# Patient Record
Sex: Female | Born: 1970
Health system: Southern US, Community
[De-identification: ages and names within clinical notes are randomized; demographics above are authoritative.]

---

## 2001-03-12 ENCOUNTER — Emergency Department (HOSPITAL_COMMUNITY): Admission: EM | Admit: 2001-03-12 | Discharge: 2001-03-12 | Payer: Self-pay | Admitting: *Deleted

## 2001-03-15 ENCOUNTER — Encounter: Payer: Self-pay | Admitting: Emergency Medicine

## 2001-03-15 ENCOUNTER — Emergency Department (HOSPITAL_COMMUNITY): Admission: EM | Admit: 2001-03-15 | Discharge: 2001-03-15 | Payer: Self-pay | Admitting: Emergency Medicine

## 2002-03-25 ENCOUNTER — Emergency Department (HOSPITAL_COMMUNITY): Admission: EM | Admit: 2002-03-25 | Discharge: 2002-03-25 | Payer: Self-pay | Admitting: *Deleted

## 2002-10-19 ENCOUNTER — Other Ambulatory Visit: Admission: RE | Admit: 2002-10-19 | Discharge: 2002-10-19 | Payer: Self-pay | Admitting: *Deleted

## 2003-09-24 ENCOUNTER — Encounter: Payer: Self-pay | Admitting: Obstetrics & Gynecology

## 2003-09-24 ENCOUNTER — Ambulatory Visit (HOSPITAL_COMMUNITY): Admission: RE | Admit: 2003-09-24 | Discharge: 2003-09-24 | Payer: Self-pay | Admitting: Obstetrics & Gynecology

## 2003-10-21 ENCOUNTER — Inpatient Hospital Stay (HOSPITAL_COMMUNITY): Admission: AD | Admit: 2003-10-21 | Discharge: 2003-10-24 | Payer: Self-pay | Admitting: Obstetrics & Gynecology

## 2005-07-10 ENCOUNTER — Emergency Department (HOSPITAL_COMMUNITY): Admission: EM | Admit: 2005-07-10 | Discharge: 2005-07-10 | Payer: Self-pay | Admitting: Emergency Medicine

## 2007-08-25 ENCOUNTER — Ambulatory Visit (HOSPITAL_COMMUNITY): Admission: RE | Admit: 2007-08-25 | Discharge: 2007-08-25 | Payer: Self-pay | Admitting: Obstetrics & Gynecology

## 2008-01-10 ENCOUNTER — Inpatient Hospital Stay (HOSPITAL_COMMUNITY): Admission: AD | Admit: 2008-01-10 | Discharge: 2008-01-13 | Payer: Self-pay | Admitting: Obstetrics & Gynecology

## 2009-02-22 IMAGING — US US OB DETAIL+14 WK
1 series · 14 of 28 positions shown · non-contrast
Comparison: none

OBSTETRICAL ULTRASOUND:

 This ultrasound exam was performed in the [HOSPITAL] Ultrasound Department.  The OB US report was generated in the AS system, and faxed to the ordering physician.  This report is also available in [REDACTED] PACS.

[Series 1: us ob detail+14 wk · 0.30mm/px · 14 of 67 slices shown]
[im 3/67]
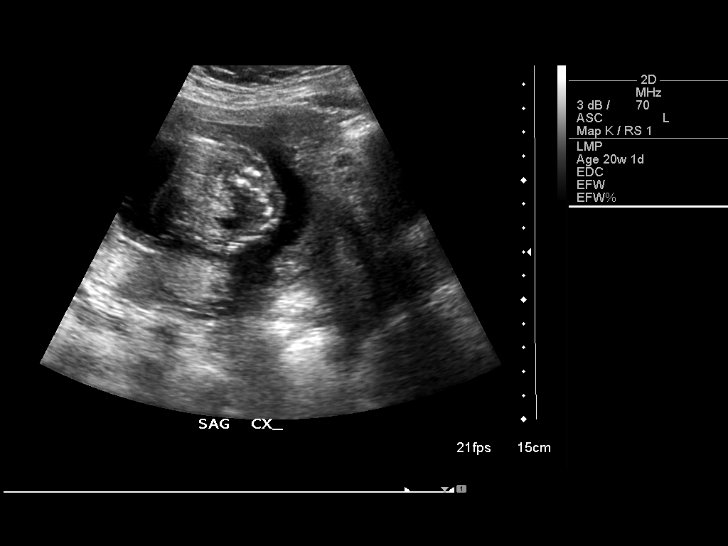
[im 8/67]
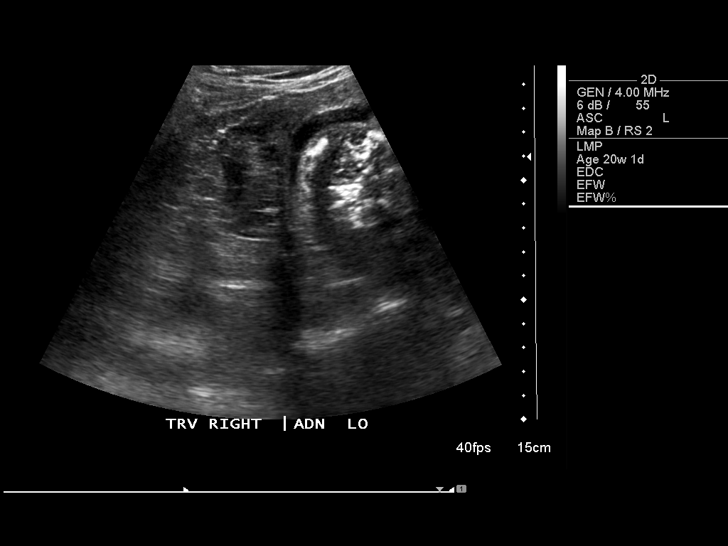
[im 13/67]
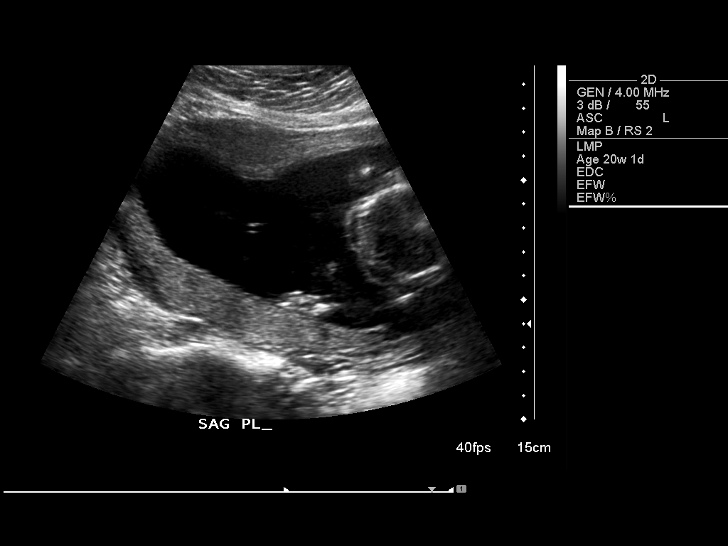
[im 18/67]
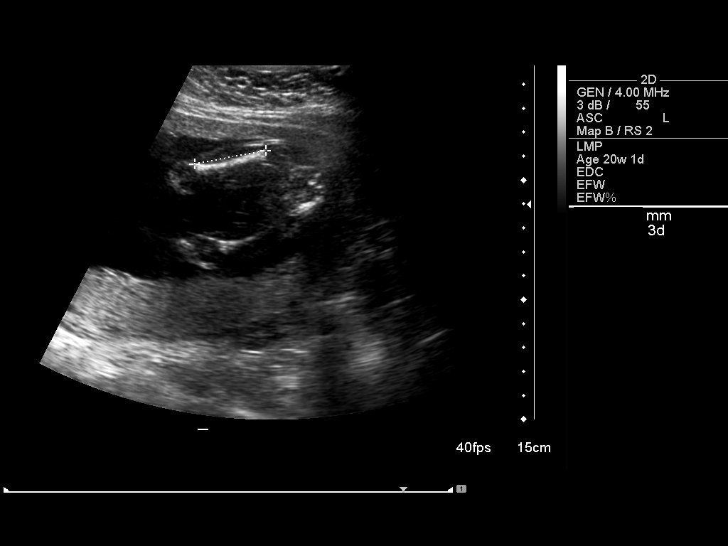
[im 23/67]
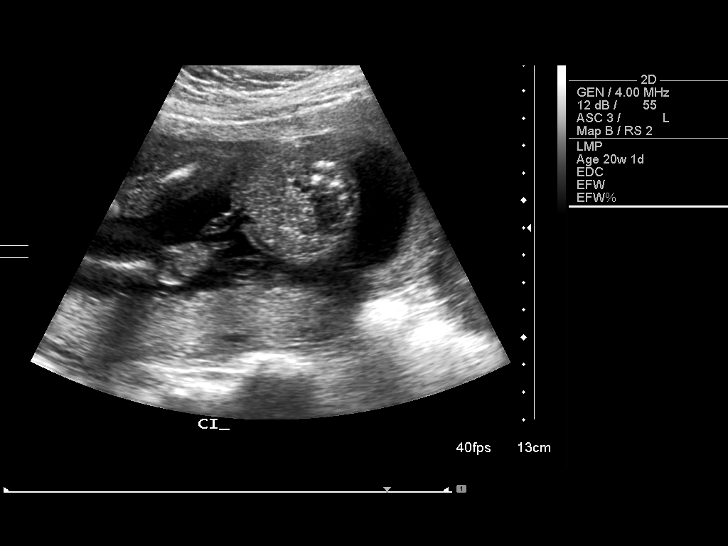
[im 27/67]
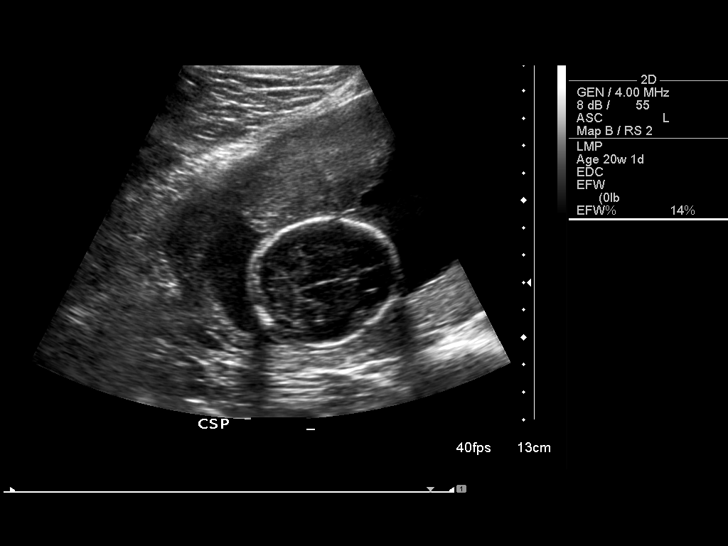
[im 32/67]
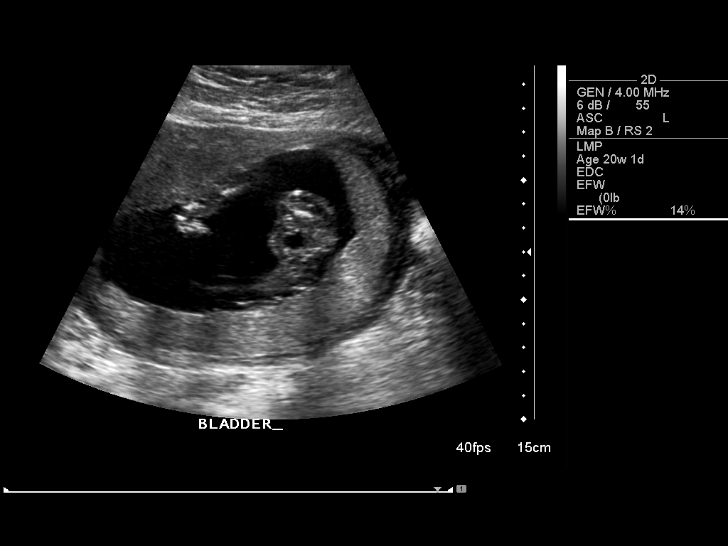
[im 37/67]
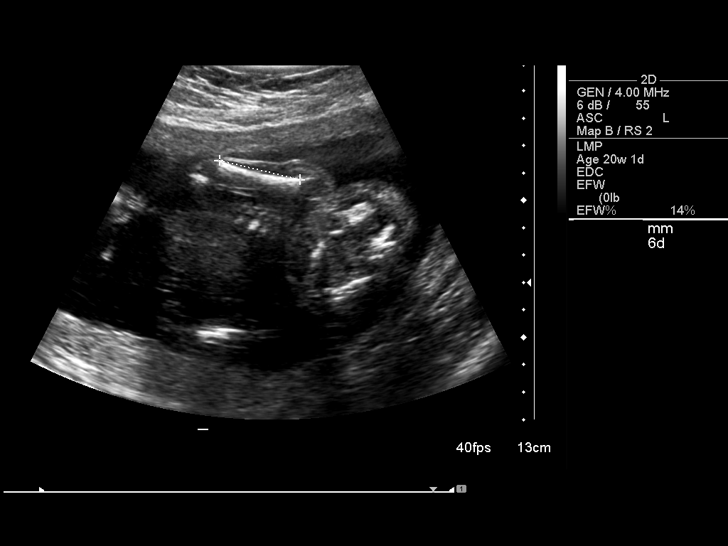
[im 42/67]
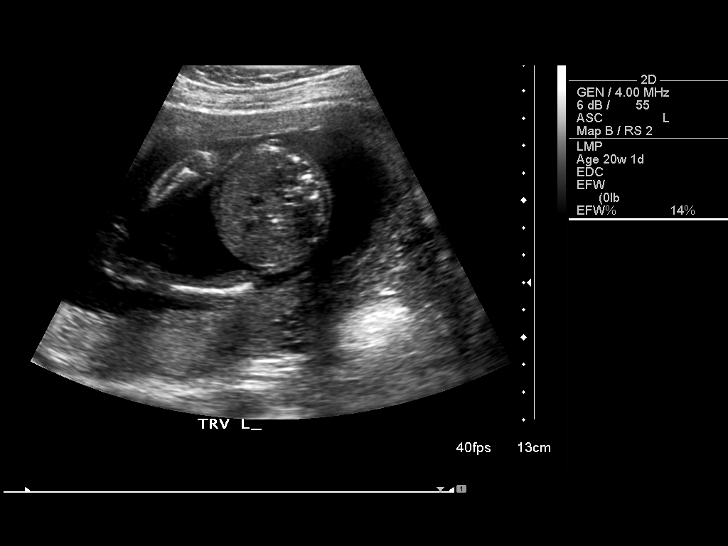
[im 47/67]
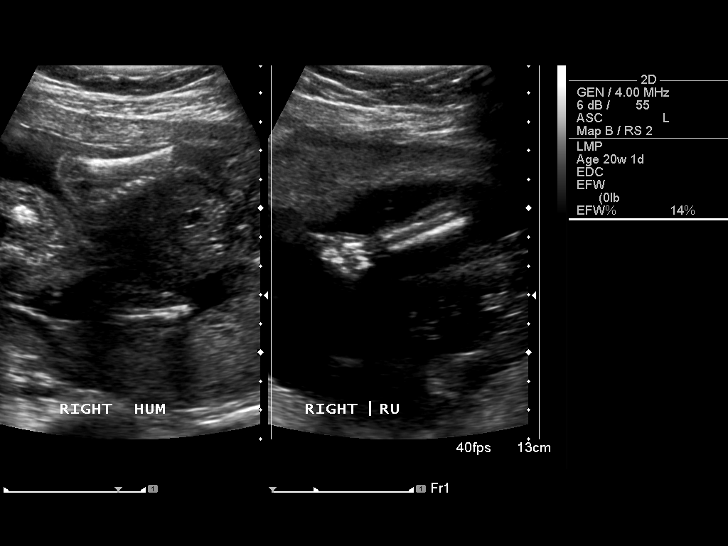
[im 52/67]
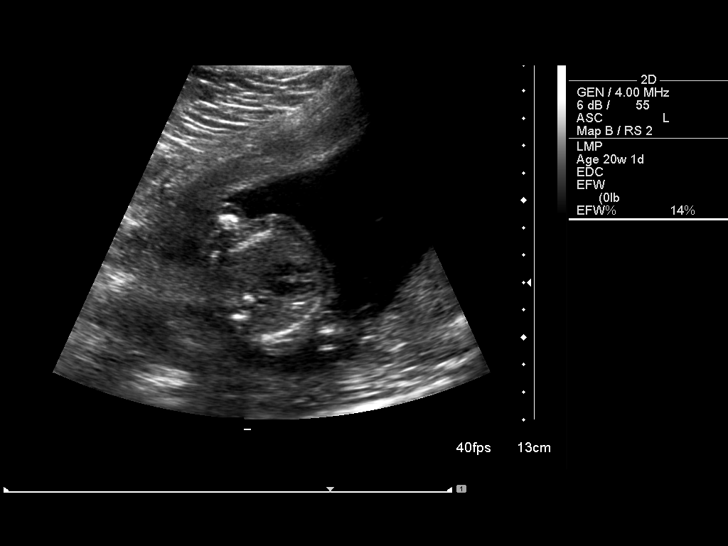
[im 57/67]
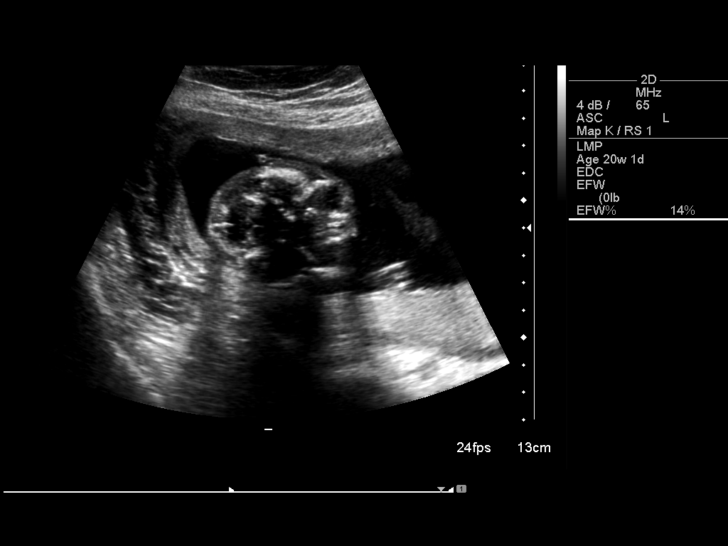
[im 62/67]
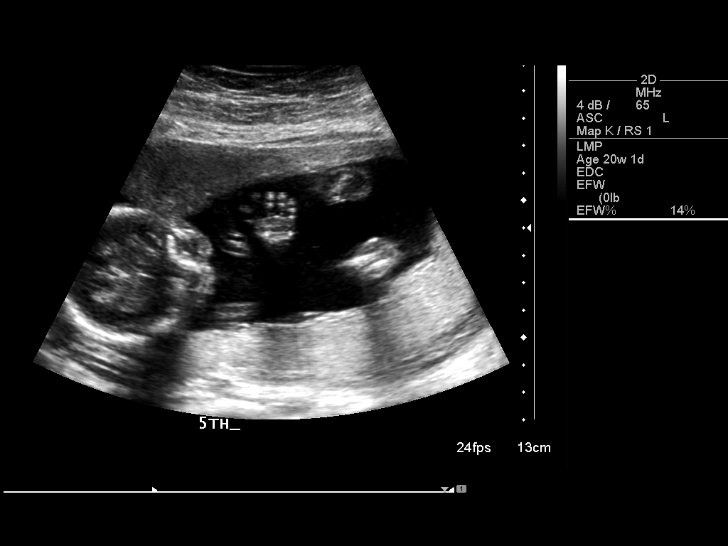
[im 67/67]
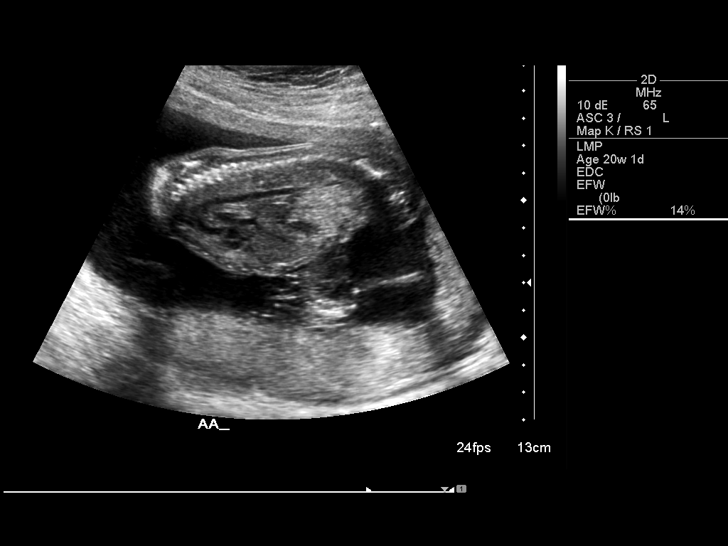

[14 of 28 positions shown; findings below may reference images not displayed]

IMPRESSION: See AS Obstetric US report.

## 2011-05-01 NOTE — H&P (Signed)
NAME:  Alyssa Gonzales, Alyssa Gonzales                    ACCOUNT NO.:  [id number]   MEDICAL RECORD NO.:  [id number]          PATIENT TYPE:  INP   LOCATION:  9127                          FACILITY:  [facility]   PHYSICIAN:  Roseanna Rainbow, M.D.DATE OF BIRTH:  Oct 28, 1971   DATE OF ADMISSION:  01/10/2008  DATE OF DISCHARGE:                              HISTORY & PHYSICAL   CHIEF COMPLAINT:  The patient is a 40 year old para 2 with an estimated  date of confinement of January 13, 2008, with an intrauterine pregnancy  at 39+ weeks complaining of uterine contractions.   HISTORY OF PRESENT ILLNESS:  Please see the above.   ALLERGIES:  No known drug allergies.   MEDICATIONS:  Please see the reconciliation form.   OB RISK FACTORS:  Advanced maternal age.   PRENATAL LABORATORIES:  Chlamydia probe negative.  Pap smear negative.  Urine culture and sensitivity:  No growth.  GC probe negative.  One-hour  GCT 127.  Hepatitis B surface antigen negative.  Hematocrit 33.5,  hemoglobin 10.8.  HIV nonreactive.  Hemoglobin electrophoresis normal.  Platelets 284,000.  Blood type AB positive, antibody screen negative.  RPR nonreactive.  Rubella immune.  GBS is negative on December 22.   OBSTETRICAL HISTORY:  In November 2004 she was delivered of a live-born  female, 7 pounds 3 ounces, full-term vaginal delivery, no complications.  In October 1997 she was delivered of a live-born female, vaginal  delivery, 6 pounds 2 ounces, no complications.   PAST GYN HISTORY:  Noncontributory.   PAST MEDICAL HISTORY:  No significant history of medical diseases.   PAST SURGICAL HISTORY:  No previous surgery.   SOCIAL HISTORY:  Married, living with her spouse.  Does not give any  significant history of alcohol usage.  Has no significant smoking  history.   FAMILY HISTORY:  No major illnesses known.   PHYSICAL EXAMINATION:  Vital signs stable, afebrile.  Fetal heart  tracing reassuring.  Sterile vaginal exam per the RN:  The cervix  is a  loose 2, 60-80%.   ASSESSMENT:  Multipara at term, early labor.  Fetal heart tracing  consistent with fetal well-being.   PLAN:  Expectant management, possible augmentation of labor.      Roseanna Rainbow, M.D.  Electronically Signed     LAJ/MEDQ  D:  01/10/2008  T:  01/11/2008  Job:  [id number]

## 2011-07-12 ENCOUNTER — Other Ambulatory Visit: Payer: Self-pay | Admitting: Obstetrics and Gynecology

## 2011-07-12 DIAGNOSIS — N644 Mastodynia: Secondary | ICD-10-CM

## 2011-07-17 ENCOUNTER — Ambulatory Visit
Admission: RE | Admit: 2011-07-17 | Discharge: 2011-07-17 | Disposition: A | Discharge status: Home / Self Care | Payer: BC Managed Care – PPO | Source: Ambulatory Visit | Attending: Obstetrics and Gynecology | Admitting: Obstetrics and Gynecology

## 2011-07-17 DIAGNOSIS — N644 Mastodynia: Secondary | ICD-10-CM

## 2011-09-06 LAB — CBC
HCT: 31.9 — ABNORMAL LOW
HCT: 32.3 — ABNORMAL LOW
HCT: 37.5
Hemoglobin: 10.5 — ABNORMAL LOW
Hemoglobin: 10.7 — ABNORMAL LOW
Hemoglobin: 12.3
MCHC: 32.7
MCHC: 33
MCHC: 33.2
MCV: 69.6 — ABNORMAL LOW
MCV: 70 — ABNORMAL LOW
MCV: 70.3 — ABNORMAL LOW
Platelets: 239
Platelets: 251
Platelets: 277
RBC: 4.58
RBC: 4.59
RBC: 5.36 — ABNORMAL HIGH
RDW: 15.8 — ABNORMAL HIGH
RDW: 15.9 — ABNORMAL HIGH
RDW: 16 — ABNORMAL HIGH
WBC: 11.2 — ABNORMAL HIGH
WBC: 13.8 — ABNORMAL HIGH
WBC: 14.9 — ABNORMAL HIGH

## 2011-09-06 LAB — RPR: RPR Ser Ql: NONREACTIVE

## 2014-09-24 ENCOUNTER — Other Ambulatory Visit: Payer: Self-pay | Admitting: Obstetrics and Gynecology

## 2014-09-24 DIAGNOSIS — N644 Mastodynia: Secondary | ICD-10-CM

## 2014-09-30 ENCOUNTER — Ambulatory Visit
Admission: RE | Admit: 2014-09-30 | Discharge: 2014-09-30 | Disposition: A | Discharge status: Home / Self Care | Payer: BC Managed Care – PPO | Source: Ambulatory Visit | Attending: Obstetrics and Gynecology | Admitting: Obstetrics and Gynecology

## 2014-09-30 DIAGNOSIS — N644 Mastodynia: Secondary | ICD-10-CM

## 2015-10-30 ENCOUNTER — Ambulatory Visit (INDEPENDENT_AMBULATORY_CARE_PROVIDER_SITE_OTHER): Payer: BLUE CROSS/BLUE SHIELD | Admitting: Family Medicine

## 2015-10-30 VITALS — BP 102/60 | HR 73 | Temp 98.7°F | Resp 16 | Ht 62.0 in | Wt 137.0 lb

## 2015-10-30 DIAGNOSIS — Z2821 Immunization not carried out because of patient refusal: Secondary | ICD-10-CM

## 2015-10-30 DIAGNOSIS — Z0000 Encounter for general adult medical examination without abnormal findings: Secondary | ICD-10-CM | POA: Diagnosis not present

## 2015-10-30 LAB — CBC
HCT: 36.7 % (ref 36.0–46.0)
Hemoglobin: 11.5 g/dL — ABNORMAL LOW (ref 12.0–15.0)
MCH: 20.9 pg — ABNORMAL LOW (ref 26.0–34.0)
MCHC: 31.3 g/dL (ref 30.0–36.0)
MCV: 66.6 fL — ABNORMAL LOW (ref 78.0–100.0)
MPV: 9.6 fL (ref 8.6–12.4)
Platelets: 289 10*3/uL (ref 150–400)
RBC: 5.51 MIL/uL — ABNORMAL HIGH (ref 3.87–5.11)
RDW: 16.3 % — ABNORMAL HIGH (ref 11.5–15.5)
WBC: 8.8 10*3/uL (ref 4.0–10.5)

## 2015-10-30 LAB — LIPID PANEL
Cholesterol: 159 mg/dL (ref 125–200)
HDL: 44 mg/dL — ABNORMAL LOW (ref >=46)
LDL Cholesterol: 94 mg/dL (ref <130)
Total CHOL/HDL Ratio: 3.6 Ratio (ref <=5.0)
Triglycerides: 107 mg/dL (ref <150)
VLDL: 21 mg/dL (ref <30)

## 2015-10-30 LAB — COMPLETE METABOLIC PANEL WITH GFR
ALT: 20 U/L (ref 6–29)
AST: 17 U/L (ref 10–30)
Albumin: 4.3 g/dL (ref 3.6–5.1)
Alkaline Phosphatase: 58 U/L (ref 33–115)
BUN: 16 mg/dL (ref 7–25)
CO2: 25 mmol/L (ref 20–31)
Calcium: 8.9 mg/dL (ref 8.6–10.2)
Chloride: 105 mmol/L (ref 98–110)
Creat: 0.46 mg/dL — ABNORMAL LOW (ref 0.50–1.10)
GFR, Est African American: >89 mL/min (ref >=60)
GFR, Est Non African American: >89 mL/min (ref >=60)
Glucose, Bld: 89 mg/dL (ref 65–99)
Potassium: 4.1 mmol/L (ref 3.5–5.3)
Sodium: 138 mmol/L (ref 135–146)
Total Bilirubin: 0.6 mg/dL (ref 0.2–1.2)
Total Protein: 7.3 g/dL (ref 6.1–8.1)

## 2015-10-30 NOTE — Progress Notes (Signed)
      Chief Complaint:  Chief Complaint  Patient presents with  . Annual Exam    Needs Insurance form completed     HPI: Alyssa Gonzales is a 44 y.o. female who [facility] complaining of:  Pap smear 2015-normal , Dr Hulen SkainsAkin Mammogram 2012, 2013-normal Decline flu vaccine, not sure if UTD on tetanus Has not eaten No sxs Non smoker Nail technician G3L3, normal vaginal birth   No past medical history on file. No past surgical history on file. Social History   Social History  . Marital Status: Married    Spouse Name: N/A  . Number of Children: N/A  . Years of Education: N/A   Social History Main Topics  . Smoking status: Never Smoker   . Smokeless tobacco: Not on file  . Alcohol Use: No  . Drug Use: No  . Sexual Activity: Not on file   Other Topics Concern  . Not on file   Social History Narrative  . No narrative on file   No family history on file. No Known Allergies Prior to Admission medications   Not on File     ROS: The patient denies fevers, chills, night sweats, unintentional weight loss, chest pain, palpitations, wheezing, dyspnea on exertion, nausea, vomiting, abdominal pain, dysuria, hematuria, melena, numbness, weakness, or tingling.   All other systems have been reviewed and were otherwise negative with the exception of those mentioned in the HPI and as above.    PHYSICAL EXAM: Filed Vitals:   10/30/15 0954  BP: 102/60  Pulse: 73  Temp: 98.7 F (37.1 C)  Resp: 16   Body mass index is 25.05 kg/(m^2).   General: Alert, no acute distress HEENT:  Normocephalic, atraumatic, oropharynx patent. EOMI, PERRLA, fundo exam normal, thyroid normal  Cardiovascular:  Regular rate and rhythm, no rubs murmurs or gallops.  No Carotid bruits, radial pulse intact. No pedal edema.  Respiratory: Clear to auscultation bilaterally.  No wheezes, rales, or rhonchi.  No cyanosis, no use of accessory musculature Abdominal: No organomegaly, abdomen is soft and  non-tender, positive bowel sounds. No masses. Skin: No rashes. Neurologic: Facial musculature symmetric. Psychiatric: Patient acts appropriately throughout our interaction. Lymphatic: No cervical or submandibular lymphadenopathy Musculoskeletal: Gait intact. No edema, tenderness. 5/5 strength, 2/2 DTRs   LABS:    EKG/XRAY:   Primary read [facility]   ASSESSMENT/PLAN: Encounter Diagnoses  Name Primary?  . Annual physical exam Yes  . Influenza vaccination declined    Labs pending Declines breast/pelvic exam, she gets it with ob/gyn Fu prn    Gross sideeffects, risk and benefits, and alternatives of medications d/w patient. Patient is aware that all medications have potential sideeffects and we are unable to predict every sideeffect or drug-drug interaction that may occur.    DO  11/02/2015 6:43 AM

## 2016-03-02 ENCOUNTER — Encounter: Payer: Self-pay | Admitting: Family Medicine

## 2016-10-24 ENCOUNTER — Encounter: Payer: Self-pay | Admitting: Family Medicine

## 2016-10-24 ENCOUNTER — Other Ambulatory Visit: Payer: Self-pay | Admitting: Physician Assistant

## 2016-10-24 ENCOUNTER — Ambulatory Visit (INDEPENDENT_AMBULATORY_CARE_PROVIDER_SITE_OTHER): Payer: BLUE CROSS/BLUE SHIELD | Admitting: Physician Assistant

## 2016-10-24 VITALS — BP 96/60 | HR 70 | Temp 98.5°F | Resp 14 | Ht 61.0 in | Wt 132.0 lb

## 2016-10-24 DIAGNOSIS — Z23 Encounter for immunization: Secondary | ICD-10-CM | POA: Diagnosis not present

## 2016-10-24 DIAGNOSIS — Z1211 Encounter for screening for malignant neoplasm of colon: Secondary | ICD-10-CM | POA: Diagnosis not present

## 2016-10-24 DIAGNOSIS — Z1329 Encounter for screening for other suspected endocrine disorder: Secondary | ICD-10-CM | POA: Diagnosis not present

## 2016-10-24 DIAGNOSIS — Z131 Encounter for screening for diabetes mellitus: Secondary | ICD-10-CM

## 2016-10-24 DIAGNOSIS — Z1159 Encounter for screening for other viral diseases: Secondary | ICD-10-CM | POA: Diagnosis not present

## 2016-10-24 DIAGNOSIS — Z13220 Encounter for screening for lipoid disorders: Secondary | ICD-10-CM | POA: Diagnosis not present

## 2016-10-24 DIAGNOSIS — Z0000 Encounter for general adult medical examination without abnormal findings: Secondary | ICD-10-CM | POA: Diagnosis not present

## 2016-10-24 DIAGNOSIS — Z1389 Encounter for screening for other disorder: Secondary | ICD-10-CM | POA: Diagnosis not present

## 2016-10-24 DIAGNOSIS — Z130 Encounter for screening for diseases of the blood and blood-forming organs and certain disorders involving the immune mechanism: Secondary | ICD-10-CM

## 2016-10-24 DIAGNOSIS — Z114 Encounter for screening for human immunodeficiency virus [HIV]: Secondary | ICD-10-CM

## 2016-10-24 LAB — LIPID PANEL
Cholesterol: 192 mg/dL (ref <200)
HDL: 45 mg/dL — ABNORMAL LOW (ref >50)
LDL Cholesterol: 128 mg/dL — ABNORMAL HIGH
Total CHOL/HDL Ratio: 4.3 Ratio (ref <5.0)
Triglycerides: 93 mg/dL (ref <150)
VLDL: 19 mg/dL (ref <30)

## 2016-10-24 LAB — COMPLETE METABOLIC PANEL WITH GFR
ALT: 23 U/L (ref 6–29)
AST: 27 U/L (ref 10–35)
Albumin: 4.2 g/dL (ref 3.6–5.1)
Alkaline Phosphatase: 51 U/L (ref 33–115)
BUN: 12 mg/dL (ref 7–25)
CO2: 26 mmol/L (ref 20–31)
Calcium: 8.9 mg/dL (ref 8.6–10.2)
Chloride: 105 mmol/L (ref 98–110)
Creat: 0.53 mg/dL (ref 0.50–1.10)
GFR, Est African American: >89 mL/min (ref >=60)
GFR, Est Non African American: >89 mL/min (ref >=60)
Glucose, Bld: 84 mg/dL (ref 65–99)
Potassium: 4 mmol/L (ref 3.5–5.3)
Sodium: 138 mmol/L (ref 135–146)
Total Bilirubin: 0.4 mg/dL (ref 0.2–1.2)
Total Protein: 7.1 g/dL (ref 6.1–8.1)

## 2016-10-24 LAB — HEPATITIS C ANTIBODY: HCV Ab: NEGATIVE

## 2016-10-24 LAB — CBC
HCT: 39.6 % (ref 35.0–45.0)
Hemoglobin: 12.4 g/dL (ref 11.7–15.5)
MCH: 21.5 pg — ABNORMAL LOW (ref 27.0–33.0)
MCHC: 31.3 g/dL — ABNORMAL LOW (ref 32.0–36.0)
MCV: 68.5 fL — ABNORMAL LOW (ref 80.0–100.0)
Platelets: 293 10*3/uL (ref 140–400)
RBC: 5.78 MIL/uL — ABNORMAL HIGH (ref 3.80–5.10)
RDW: 17 % — ABNORMAL HIGH (ref 11.0–15.0)
WBC: 7.4 10*3/uL (ref 3.8–10.8)

## 2016-10-24 LAB — HIV ANTIBODY (ROUTINE TESTING W REFLEX): HIV 1&2 Ab, 4th Generation: NONREACTIVE

## 2016-10-24 LAB — TSH: TSH: 2.14 mIU/L

## 2016-10-24 LAB — HEMOGLOBIN A1C
Hgb A1c MFr Bld: 5.4 % (ref <5.7)
Mean Plasma Glucose: 108 mg/dL

## 2016-10-24 NOTE — Patient Instructions (Signed)
     IF you received an x-ray today, you will receive an invoice from [facility]. Please contact [facility] at [phone] with questions or concerns regarding your invoice.   IF you received labwork today, you will receive an invoice from [facility]. Please contact [facility] at [phone] with questions or concerns regarding your invoice.   Our billing staff will not be able to assist you with questions regarding bills from these companies.  You will be contacted with the lab results as soon as they are available. The fastest way to get your results is to activate your My Chart account. Instructions are located on the last page of this paperwork. If you have not heard from us regarding the results in 2 weeks, please contact this office.      

## 2016-10-24 NOTE — Progress Notes (Signed)
Please add peripheral smear, dx microcytosis.

## 2016-10-24 NOTE — Progress Notes (Signed)
10/24/2016 9:02 AM   DOB: 1971/05/28 / MRN: [id number]  SUBJECTIVE:  Interpreter [id number]  Alyssa Gonzales is a 45 y.o. female presenting for annual exam.  Reports she had a PAP in October of 2016 at [facility] [address] and this was normal. She also had a mammogram at that time. She has never had a colonoscopy and would like this ordered today.     She has No Known Allergies.   She  has no past medical history on file.    She  reports that she has never smoked. She does not have any smokeless tobacco history on file. She reports that she does not drink alcohol or use drugs. She  has no sexual activity history on file. The patient  has no past surgical history on file.  Her family history includes Diabetes in her father.  Review of Systems  Constitutional: Negative for chills and fever.  Cardiovascular: Negative for chest pain.  Musculoskeletal: Negative for myalgias.  Skin: Negative for itching and rash.  Neurological: Negative for dizziness.    The problem list and medications were reviewed and updated by myself where necessary and exist elsewhere in the encounter.   OBJECTIVE:  BP 96/60   Pulse 70   Temp 98.5 F (36.9 C) (Oral)   Resp 14   Ht 5\' 1"  (1.549 m)   Wt 132 lb (59.9 kg)   LMP 10/16/2016   SpO2 100%   BMI 24.94 kg/m   Physical Exam  Constitutional: She is oriented to person, place, and time. She appears well-developed and well-nourished. No distress.  Cardiovascular: Normal rate and regular rhythm.   Pulmonary/Chest: Effort normal and breath sounds normal.  Abdominal: Bowel sounds are normal.  Musculoskeletal: Normal range of motion.  Neurological: She is alert and oriented to person, place, and time.  Skin: Skin is warm and dry. She is not diaphoretic.  Psychiatric: She has a normal mood and affect.    No results found for this or any previous visit (from the past 72 hour(s)).  No results found.  ASSESSMENT AND PLAN  Alyssa Gonzales was seen today for  annual exam.  Diagnoses and all orders for this visit:  Annual physical exam: [facility] and was unaware of the need for colonoscopy.  I have placed and will fill in any gaps in care. Advised that she continue to see her GYN for female oriented care however she should also come here for screenings across the spectrum of care.   Screening for deficiency anemia -     CBC  Screening for nephropathy -     COMPLETE METABOLIC PANEL WITH GFR  Screening for lipid disorders -     Lipid panel  Screening for thyroid disorder -     TSH  Screening for diabetes mellitus -     Hemoglobin A1c  Screening for HIV (human immunodeficiency virus) -     HIV antibody  Need for hepatitis C screening test -     Hepatitis C antibody  Screening for colon cancer -     Ambulatory referral to Gastroenterology  Needs flu shot -     Flu Vaccine QUAD 36+ mos IM    The patient is advised to call or return to clinic if she does not see an improvement in symptoms, or to seek the care of the closest emergency department if she worsens with the above plan.   Deliah BostonMichael Clark, MHS, PA-C  [facility] 10/24/2016 9:02 AM

## 2016-10-25 LAB — DIFFERENTIAL
Basophils Absolute: 0 cells/uL (ref 0–200)
Basophils Relative: 0 %
Eosinophils Absolute: 76 cells/uL (ref 15–500)
Eosinophils Relative: 1 %
Lymphocytes Relative: 28 %
Lymphs Abs: 2128 cells/uL (ref 850–3900)
Monocytes Absolute: 380 cells/uL (ref 200–950)
Monocytes Relative: 5 %
Neutro Abs: 5016 cells/uL (ref 1500–7800)
Neutrophils Relative %: 66 %

## 2016-10-26 NOTE — Progress Notes (Signed)
Did we add the peripheral smear or save smear? If we did not she will need to come back in for a blood draw only. Deliah BostonMichael Lavar Rosenzweig, MS, PA-C 8:33 AM, 10/26/2016

## 2016-12-27 ENCOUNTER — Encounter: Payer: Self-pay | Admitting: Physician Assistant

## 2017-12-05 DIAGNOSIS — Z01419 Encounter for gynecological examination (general) (routine) without abnormal findings: Secondary | ICD-10-CM | POA: Diagnosis not present

## 2017-12-05 DIAGNOSIS — Z1231 Encounter for screening mammogram for malignant neoplasm of breast: Secondary | ICD-10-CM | POA: Diagnosis not present

## 2017-12-05 DIAGNOSIS — Z6825 Body mass index (BMI) 25.0-25.9, adult: Secondary | ICD-10-CM | POA: Diagnosis not present

## 2018-12-11 DIAGNOSIS — Z0000 Encounter for general adult medical examination without abnormal findings: Secondary | ICD-10-CM | POA: Diagnosis not present

## 2018-12-24 DIAGNOSIS — Z1231 Encounter for screening mammogram for malignant neoplasm of breast: Secondary | ICD-10-CM | POA: Diagnosis not present

## 2018-12-24 DIAGNOSIS — Z01419 Encounter for gynecological examination (general) (routine) without abnormal findings: Secondary | ICD-10-CM | POA: Diagnosis not present

## 2018-12-24 DIAGNOSIS — Z6824 Body mass index (BMI) 24.0-24.9, adult: Secondary | ICD-10-CM | POA: Diagnosis not present

## 2018-12-29 ENCOUNTER — Encounter (HOSPITAL_COMMUNITY): Payer: Self-pay | Admitting: Emergency Medicine

## 2018-12-29 ENCOUNTER — Emergency Department (HOSPITAL_COMMUNITY)
Admission: EM | Admit: 2018-12-29 | Discharge: 2018-12-29 | Disposition: A | Discharge status: Home / Self Care | Payer: BLUE CROSS/BLUE SHIELD | Attending: Emergency Medicine | Admitting: Emergency Medicine

## 2018-12-29 ENCOUNTER — Other Ambulatory Visit: Payer: Self-pay

## 2018-12-29 ENCOUNTER — Emergency Department (HOSPITAL_COMMUNITY): Payer: BLUE CROSS/BLUE SHIELD

## 2018-12-29 DIAGNOSIS — R1084 Generalized abdominal pain: Secondary | ICD-10-CM | POA: Diagnosis not present

## 2018-12-29 DIAGNOSIS — R0689 Other abnormalities of breathing: Secondary | ICD-10-CM | POA: Diagnosis not present

## 2018-12-29 DIAGNOSIS — R52 Pain, unspecified: Secondary | ICD-10-CM | POA: Diagnosis not present

## 2018-12-29 DIAGNOSIS — M549 Dorsalgia, unspecified: Secondary | ICD-10-CM | POA: Diagnosis not present

## 2018-12-29 DIAGNOSIS — K219 Gastro-esophageal reflux disease without esophagitis: Secondary | ICD-10-CM | POA: Diagnosis not present

## 2018-12-29 DIAGNOSIS — R109 Unspecified abdominal pain: Secondary | ICD-10-CM | POA: Diagnosis not present

## 2018-12-29 DIAGNOSIS — R064 Hyperventilation: Secondary | ICD-10-CM | POA: Diagnosis not present

## 2018-12-29 DIAGNOSIS — E876 Hypokalemia: Secondary | ICD-10-CM | POA: Insufficient documentation

## 2018-12-29 DIAGNOSIS — R079 Chest pain, unspecified: Secondary | ICD-10-CM | POA: Diagnosis not present

## 2018-12-29 DIAGNOSIS — R200 Anesthesia of skin: Secondary | ICD-10-CM | POA: Insufficient documentation

## 2018-12-29 LAB — I-STAT CHEM 8, ED
BUN: 21 mg/dL — ABNORMAL HIGH (ref 6–20)
Calcium, Ion: 1.04 mmol/L — ABNORMAL LOW (ref 1.15–1.40)
Chloride: 107 mmol/L (ref 98–111)
Creatinine, Ser: 0.5 mg/dL (ref 0.44–1.00)
Glucose, Bld: 164 mg/dL — ABNORMAL HIGH (ref 70–99)
HCT: 37 % (ref 36.0–46.0)
Hemoglobin: 12.6 g/dL (ref 12.0–15.0)
Potassium: 2.6 mmol/L — CL (ref 3.5–5.1)
Sodium: 140 mmol/L (ref 135–145)
TCO2: 17 mmol/L — ABNORMAL LOW (ref 22–32)

## 2018-12-29 LAB — CBC
HCT: 38.4 % (ref 36.0–46.0)
Hemoglobin: 11.4 g/dL — ABNORMAL LOW (ref 12.0–15.0)
MCH: 20.1 pg — ABNORMAL LOW (ref 26.0–34.0)
MCHC: 29.7 g/dL — ABNORMAL LOW (ref 30.0–36.0)
MCV: 67.7 fL — ABNORMAL LOW (ref 80.0–100.0)
Platelets: 246 10*3/uL (ref 150–400)
RBC: 5.67 MIL/uL — ABNORMAL HIGH (ref 3.87–5.11)
RDW: 15.6 % — ABNORMAL HIGH (ref 11.5–15.5)
WBC: 11.2 10*3/uL — ABNORMAL HIGH (ref 4.0–10.5)
nRBC: 0 % (ref 0.0–0.2)

## 2018-12-29 LAB — URINALYSIS, ROUTINE W REFLEX MICROSCOPIC
Bilirubin Urine: NEGATIVE
Glucose, UA: NEGATIVE mg/dL
Hgb urine dipstick: NEGATIVE
Ketones, ur: 5 mg/dL — AB
Leukocytes, UA: NEGATIVE
Nitrite: NEGATIVE
Protein, ur: NEGATIVE mg/dL
Specific Gravity, Urine: >1.046 — ABNORMAL HIGH (ref 1.005–1.030)
pH: 7 (ref 5.0–8.0)

## 2018-12-29 LAB — COMPREHENSIVE METABOLIC PANEL
ALT: 14 U/L (ref 0–44)
AST: 29 U/L (ref 15–41)
Albumin: 3.7 g/dL (ref 3.5–5.0)
Alkaline Phosphatase: 51 U/L (ref 38–126)
Anion gap: 15 (ref 5–15)
BUN: 20 mg/dL (ref 6–20)
CO2: 17 mmol/L — ABNORMAL LOW (ref 22–32)
Calcium: 9 mg/dL (ref 8.9–10.3)
Chloride: 107 mmol/L (ref 98–111)
Creatinine, Ser: 0.77 mg/dL (ref 0.44–1.00)
GFR calc Af Amer: >60 mL/min (ref >60)
GFR calc non Af Amer: >60 mL/min (ref >60)
Glucose, Bld: 166 mg/dL — ABNORMAL HIGH (ref 70–99)
Potassium: 2.8 mmol/L — ABNORMAL LOW (ref 3.5–5.1)
Sodium: 139 mmol/L (ref 135–145)
Total Bilirubin: 0.7 mg/dL (ref 0.3–1.2)
Total Protein: 7.1 g/dL (ref 6.5–8.1)

## 2018-12-29 LAB — I-STAT BETA HCG BLOOD, ED (MC, WL, AP ONLY): I-stat hCG, quantitative: <5 m[IU]/mL (ref <5)

## 2018-12-29 LAB — LIPASE, BLOOD: Lipase: 36 U/L (ref 11–51)

## 2018-12-29 LAB — I-STAT TROPONIN, ED
Troponin i, poc: 0.01 ng/mL (ref 0.00–0.08)
Troponin i, poc: 0 ng/mL (ref 0.00–0.08)

## 2018-12-29 MED ORDER — POTASSIUM CHLORIDE ER 10 MEQ PO TBCR: 10.0000 meq | EXTENDED_RELEASE_TABLET | Freq: Every day | ORAL | 0 refills | Status: AC

## 2018-12-29 MED ORDER — POTASSIUM CHLORIDE CRYS ER 20 MEQ PO TBCR
40.0000 meq | EXTENDED_RELEASE_TABLET | Freq: Once | ORAL | Status: AC
  Administered 2018-12-29: 40 meq via ORAL
  Filled 2018-12-29: qty 2

## 2018-12-29 MED ORDER — SUCRALFATE 1 G PO TABS: 1.0000 g | ORAL_TABLET | Freq: Three times a day (TID) | ORAL | 0 refills | Status: DC

## 2018-12-29 MED ORDER — CALCIUM CARBONATE ANTACID 500 MG PO CHEW
1.0000 | CHEWABLE_TABLET | Freq: Once | ORAL | Status: AC
  Administered 2018-12-29: 200 mg via ORAL
  Filled 2018-12-29 (×2): qty 1

## 2018-12-29 MED ORDER — HYDROMORPHONE HCL 1 MG/ML IJ SOLN
1.0000 mg | Freq: Once | INTRAMUSCULAR | Status: AC
  Administered 2018-12-29: 1 mg via INTRAVENOUS
  Filled 2018-12-29: qty 1

## 2018-12-29 MED ORDER — ONDANSETRON HCL 4 MG/2ML IJ SOLN
4.0000 mg | Freq: Once | INTRAMUSCULAR | Status: AC
  Administered 2018-12-29: 4 mg via INTRAVENOUS
  Filled 2018-12-29: qty 2

## 2018-12-29 MED ORDER — OMEPRAZOLE 20 MG PO CPDR: 20.0000 mg | DELAYED_RELEASE_CAPSULE | Freq: Every day | ORAL | 0 refills | Status: DC

## 2018-12-29 MED ORDER — FAMOTIDINE IN NACL 20-0.9 MG/50ML-% IV SOLN
20.0000 mg | Freq: Once | INTRAVENOUS | Status: AC
  Administered 2018-12-29: 20 mg via INTRAVENOUS
  Filled 2018-12-29: qty 50

## 2018-12-29 MED ORDER — IOPAMIDOL (ISOVUE-370) INJECTION 76%
100.0000 mL | Freq: Once | INTRAVENOUS | Status: AC | PRN
  Administered 2018-12-29: 100 mL via INTRAVENOUS

## 2018-12-29 MED ORDER — IOPAMIDOL (ISOVUE-370) INJECTION 76%
INTRAVENOUS | Status: AC
  Filled 2018-12-29: qty 100

## 2018-12-29 NOTE — ED Notes (Signed)
Patient transported to CT 

## 2018-12-29 NOTE — ED Triage Notes (Addendum)
Pt in from home via [facility] with c/o abdominal pain, emesis and cp that started at 11p last night after eating shrimp. Denies any diarrhea. [place] speaking, EMS states emesis x 1. Given Fentanyl and 4mg  Zofran en route. CBG 156, hyperventilating on ED arrival

## 2018-12-29 NOTE — ED Notes (Signed)
Patient verbalizes understanding of discharge instructions. Opportunity for questioning and answers were provided. Armband removed by staff, pt discharged from ED. Ambulated to waiting room

## 2018-12-29 NOTE — ED Provider Notes (Signed)
[facility] EMERGENCY DEPARTMENT Provider Note   CSN: [id number] Arrival date & time: 12/29/18  1191     History   Chief Complaint Chief Complaint  Patient presents with  . Abdominal Pain  . Chest Pain    HPI Alyssa Gonzales is a 48 y.o. female.  Patient with no pertinent past medical history presents to the emergency department with a chief complaint of chest pain that radiates to her back.  She states that it also radiates into her abdomen.  Additionally, she states that it feels like her lower extremities are numb.  She states that the symptoms started yesterday evening around 11 PM after having eaten shrimp.  She did have one episode of forceful vomiting.  Denies any fevers or chills.  Denies any other associated symptoms.  No treatments prior to arrival.  There are no alleviating factors.  The history is provided by the patient. The history is limited by a language barrier. A language interpreter was used.    No past medical history on file.  There are no active problems to display for this patient.   History reviewed. No pertinent surgical history.   OB History   No obstetric history on file.      Home Medications    Prior to Admission medications   Not on File    Family History Family History  Problem Relation Age of Onset  . Diabetes Father     Social History Social History   Tobacco Use  . Smoking status: Never Smoker  Substance Use Topics  . Alcohol use: No    Alcohol/week: 0.0 standard drinks  . Drug use: No     Allergies   Patient has no known allergies.   Review of Systems Review of Systems  All other systems reviewed and are negative.    Physical Exam Updated Vital Signs Wt 60 kg   SpO2 98%   BMI 24.99 kg/m   Physical Exam Vitals signs and nursing note reviewed.  Constitutional:      Appearance: She is well-developed.  HENT:     Head: Normocephalic and atraumatic.  Eyes:     Conjunctiva/sclera:  Conjunctivae normal.     Pupils: Pupils are equal, round, and reactive to light.  Neck:     Musculoskeletal: Normal range of motion and neck supple.  Cardiovascular:     Rate and Rhythm: Normal rate and regular rhythm.     Heart sounds: No murmur. No friction rub. No gallop.      Comments: Distal pulses intact Pulmonary:     Effort: Pulmonary effort is normal. No respiratory distress.     Breath sounds: Normal breath sounds. No wheezing or rales.     Comments: Lung sounds are clear Chest:     Chest wall: No tenderness.  Abdominal:     General: Bowel sounds are normal. There is no distension.     Palpations: Abdomen is soft. There is no mass.     Tenderness: There is no abdominal tenderness. There is no guarding or rebound.     Comments: No focal tenderness on exam, no Murphy sign, no lower abdominal tenderness  Musculoskeletal: Normal range of motion.        General: No tenderness.  Skin:    General: Skin is warm and dry.  Neurological:     Mental Status: She is alert and oriented to person, place, and time.  Psychiatric:        Behavior: Behavior normal.  Thought Content: Thought content normal.        Judgment: Judgment normal.      ED Treatments / Results  Labs (all labs ordered are listed, but only abnormal results are displayed) Labs Reviewed  COMPREHENSIVE METABOLIC PANEL - Abnormal; Notable for the following components:      Result Value   Potassium 2.8 (*)    CO2 17 (*)    Glucose, Bld 166 (*)    All other components within normal limits  CBC - Abnormal; Notable for the following components:   WBC 11.2 (*)    RBC 5.67 (*)    Hemoglobin 11.4 (*)    MCV 67.7 (*)    MCH 20.1 (*)    MCHC 29.7 (*)    RDW 15.6 (*)    All other components within normal limits  I-STAT CHEM 8, ED - Abnormal; Notable for the following components:   Potassium 2.6 (*)    BUN 21 (*)    Glucose, Bld 164 (*)    Calcium, Ion 1.04 (*)    TCO2 17 (*)    All other components  within normal limits  LIPASE, BLOOD  URINALYSIS, ROUTINE W REFLEX MICROSCOPIC  I-STAT BETA HCG BLOOD, ED ([facility], [facility], [facility] ONLY)  I-STAT TROPONIN, ED    EKG EKG Interpretation  Date/Time:  Monday December 29 2018 03:19:40 EST Ventricular Rate:  80 PR Interval:    QRS Duration: 84 QT Interval:  532 QTC Calculation: 614 R Axis:   41 Text Interpretation:  Sinus rhythm Abnormal T, consider ischemia, anterior leads Prolonged QT interval No old tracing to compare Confirmed by Rochele RaringWard, Kristen [phone]([id number]) on 12/29/2018 3:40:28 AM   Radiology Dg Chest Port 1 View  Result Date: 12/29/2018 CLINICAL DATA:  Chest/abdominal pain EXAM: PORTABLE CHEST 1 VIEW COMPARISON:  None. FINDINGS: Lungs are clear.  No pleural effusion or pneumothorax. The heart is top-normal in size. IMPRESSION: No evidence of acute cardiopulmonary disease. Electronically Signed   By: Charline BillsSriyesh  Krishnan M.D.   On: 12/29/2018 03:58   Ct Angio Chest/abd/pel For Dissection W And/or Wo Contrast  Result Date: 12/29/2018 CLINICAL DATA:  48 y/o F; abdominal pain, emesis, and chest pain after eating shrimp. EXAM: CT ANGIOGRAPHY CHEST, ABDOMEN AND PELVIS TECHNIQUE: Multidetector CT imaging through the chest, abdomen and pelvis was performed using the standard protocol during bolus administration of intravenous contrast. Multiplanar reconstructed images and MIPs were obtained and reviewed to evaluate the vascular anatomy. CONTRAST:  100mL ISOVUE-370 IOPAMIDOL (ISOVUE-370) INJECTION 76% COMPARISON:  None. FINDINGS: CTA CHEST FINDINGS Cardiovascular: Preferential opacification of the thoracic aorta. No evidence of thoracic aortic aneurysm or dissection. Normal heart size. No pericardial effusion. Mediastinum/Nodes: No enlarged mediastinal, hilar, or axillary lymph nodes. Thyroid gland, trachea, and esophagus demonstrate no significant findings. Lungs/Pleura: Left lower lobe platelike atelectasis. No consolidation, effusion, or pneumothorax. Musculoskeletal:  No chest wall abnormality. No acute or significant osseous findings. Review of the MIP images confirms the above findings. CTA ABDOMEN AND PELVIS FINDINGS VASCULAR Aorta: Normal caliber aorta without aneurysm, dissection, vasculitis or significant stenosis. Celiac: Patent without evidence of aneurysm, dissection, vasculitis or significant stenosis. SMA: Patent without evidence of aneurysm, dissection, vasculitis or significant stenosis. Renals: Both renal arteries are patent without evidence of aneurysm, dissection, vasculitis, fibromuscular dysplasia or significant stenosis. IMA: Patent without evidence of aneurysm, dissection, vasculitis or significant stenosis. Inflow: Patent without evidence of aneurysm, dissection, vasculitis or significant stenosis. Veins: No obvious venous abnormality within the limitations of this arterial phase study. Review of  the MIP images confirms the above findings. NON-VASCULAR Hepatobiliary: No focal liver abnormality is seen. No gallstones, gallbladder wall thickening, or biliary dilatation. Pancreas: Unremarkable. No pancreatic ductal dilatation or surrounding inflammatory changes. Spleen: Normal in size without focal abnormality. Adrenals/Urinary Tract: Adrenal glands are unremarkable. Kidneys are normal, without renal calculi, focal lesion, or hydronephrosis. Bladder is unremarkable. Stomach/Bowel: Gastric distention. Small volume debris in the lower esophagus likely representing reflux. No obstructive or inflammatory changes of the small and large bowel. Appendix not identified, no pericecal inflammation. Lymphatic: Aortic atherosclerosis. No enlarged abdominal or pelvic lymph nodes. Reproductive: Uterus and bilateral adnexa are unremarkable. Other: No abdominal wall hernia or abnormality. No abdominopelvic ascites. Musculoskeletal: No fracture is seen. Review of the MIP images confirms the above findings. IMPRESSION: 1. No evidence of aortic aneurysm or dissection. 2. Gastric  distention. Small volume debris in the lower esophagus likely representing reflux. 3. Left lower lobe platelike atelectasis. 4. Aortic Atherosclerosis (ICD10-I70.0). Electronically Signed   By: Mitzi Hansen M.D.   On: 12/29/2018 04:28    Procedures Procedures (including critical care time)  Medications Ordered in ED Medications - No data to display   Initial Impression / Assessment and Plan / ED Course  I have reviewed the triage vital signs and the nursing notes.  Pertinent labs & imaging results that were available during my care of the patient were reviewed by me and considered in my medical decision making (see chart for details).     Patient with CP and abdominal pain.  Pain radiates to the back.  Came on suddenly after eating tonight.  Seems reflux related, but patient also states reports decreased sensation in lower extremities.  Will check CT dissection study.  Patient does have strong distal pulses.  CXR negative.  Labs show hypokalemia to 2.8, but otherwise reassuring.  Will give potassium.  Dissection study negative.  EKG is abnormal, but nothing to compare to.  Trop negative.  I doubt ACS, presentation does not seem consistent with ACS.    Discussed with Dr. Elesa Massed, who recommends repeat troponin and if negative agrees with plan for discharge.  Final Clinical Impressions(s) / ED Diagnoses   Final diagnoses:  Gastroesophageal reflux disease, esophagitis presence not specified  Hypokalemia    ED Discharge Orders         Ordered    omeprazole (PRILOSEC) 20 MG capsule  Daily     12/29/18 0513    sucralfate (CARAFATE) 1 g tablet  3 times daily with meals & bedtime     12/29/18 0513    potassium chloride (K-DUR) 10 MEQ tablet  Daily     12/29/18 0514           Roxy Horseman, PA-C 12/29/18 0518    Ward, Layla Maw, DO 12/29/18 [phone]

## 2019-10-19 ENCOUNTER — Ambulatory Visit: Payer: BLUE CROSS/BLUE SHIELD | Admitting: Registered Nurse

## 2019-10-19 ENCOUNTER — Other Ambulatory Visit: Payer: Self-pay

## 2019-10-19 ENCOUNTER — Encounter: Payer: Self-pay | Admitting: Registered Nurse

## 2019-10-19 VITALS — BP 115/59 | HR 65 | Temp 97.9°F | Resp 16 | Ht 61.0 in | Wt 136.0 lb

## 2019-10-19 DIAGNOSIS — Z13228 Encounter for screening for other metabolic disorders: Secondary | ICD-10-CM

## 2019-10-19 DIAGNOSIS — Z0000 Encounter for general adult medical examination without abnormal findings: Secondary | ICD-10-CM

## 2019-10-19 DIAGNOSIS — Z1329 Encounter for screening for other suspected endocrine disorder: Secondary | ICD-10-CM

## 2019-10-19 DIAGNOSIS — Z0001 Encounter for general adult medical examination with abnormal findings: Secondary | ICD-10-CM

## 2019-10-19 DIAGNOSIS — Z130 Encounter for screening for diseases of the blood and blood-forming organs and certain disorders involving the immune mechanism: Secondary | ICD-10-CM | POA: Diagnosis not present

## 2019-10-19 DIAGNOSIS — K219 Gastro-esophageal reflux disease without esophagitis: Secondary | ICD-10-CM | POA: Insufficient documentation

## 2019-10-19 DIAGNOSIS — Z13220 Encounter for screening for lipoid disorders: Secondary | ICD-10-CM

## 2019-10-19 MED ORDER — SUCRALFATE 1 G PO TABS: 1.0000 g | ORAL_TABLET | Freq: Three times a day (TID) | ORAL | 3 refills | Status: AC

## 2019-10-19 MED ORDER — OMEPRAZOLE 20 MG PO CPDR: 20.0000 mg | DELAYED_RELEASE_CAPSULE | Freq: Every day | ORAL | 3 refills | Status: DC

## 2019-10-19 MED ORDER — SUCRALFATE 1 G PO TABS: 1.0000 g | ORAL_TABLET | Freq: Three times a day (TID) | ORAL | 3 refills | Status: DC

## 2019-10-19 MED ORDER — OMEPRAZOLE 20 MG PO CPDR: 20.0000 mg | DELAYED_RELEASE_CAPSULE | Freq: Every day | ORAL | 3 refills | Status: AC

## 2019-10-19 NOTE — Patient Instructions (Addendum)
Pt presented to office today for CPE and labs Normal findings on CPE - no concerns arose. Refilled omeprazole, sucralfate, and K-DUR.  Labs drawn - TSH, A1c, CBC, CMP, and Lipid Panel. Results expected within 1-2 weeks. Will follow up as warranted. If labs within normal limits, follow up in 1 year for your next complete physical exam and labs. Patient encouraged to call clinic with any questions, comments, or concerns.  Thank you for coming to our office today.  Kathrin Ruddy, NP   If you have lab work done today you will be contacted with your lab results within the next 2 weeks.  If you have not heard from [place] then please contact us. The fastest way to get your results is to register for My Chart.   IF you received an x-ray today, you will receive an invoice from [facility]. Please contact [facility] at [phone] with questions or concerns regarding your invoice.   IF you received labwork today, you will receive an invoice from [facility]. Please contact [facility] at [phone] with questions or concerns regarding your invoice.   Our billing staff will not be able to assist you with questions regarding bills from these companies.  You will be contacted with the lab results as soon as they are available. The fastest way to get your results is to activate your My Chart account. Instructions are located on the last page of this paperwork. If you have not heard from [place] regarding the results in 2 weeks, please contact this office.       Health Maintenance, Female Adopting a healthy lifestyle and getting preventive care are important in promoting health and wellness. Ask your health care provider about:  The right schedule for you to have regular tests and exams.  Things you can do on your own to prevent diseases and keep yourself healthy. What should I know about diet, weight, and exercise? Eat a healthy diet   Eat a diet that includes plenty of vegetables,  fruits, low-fat dairy products, and lean protein.  Do not eat a lot of foods that are high in solid fats, added sugars, or sodium. Maintain a healthy weight Body mass index (BMI) is used to identify weight problems. It estimates body fat based on height and weight. Your health care provider can help determine your BMI and help you achieve or maintain a healthy weight. Get regular exercise Get regular exercise. This is one of the most important things you can do for your health. Most adults should:  Exercise for at least 150 minutes each week. The exercise should increase your heart rate and make you sweat (moderate-intensity exercise).  Do strengthening exercises at least twice a week. This is in addition to the moderate-intensity exercise.  Spend less time sitting. Even light physical activity can be beneficial. Watch cholesterol and blood lipids Have your blood tested for lipids and cholesterol at 48 years of age, then have this test every 5 years. Have your cholesterol levels checked more often if:  Your lipid or cholesterol levels are high.  You are older than 48 years of age.  You are at high risk for heart disease. What should I know about cancer screening? Depending on your health history and family history, you may need to have cancer screening at various ages. This may include screening for:  Breast cancer.  Cervical cancer.  Colorectal cancer.  Skin cancer.  Lung cancer. What should I know about heart disease, diabetes, and high blood pressure? Blood  pressure and heart disease  High blood pressure causes heart disease and increases the risk of stroke. This is more likely to develop in people who have high blood pressure readings, are of African descent, or are overweight.  Have your blood pressure checked: ? Every 3-5 years if you are 3018-48 years of age. ? Every year if you are 240 years old or older. Diabetes Have regular diabetes screenings. This checks your  fasting blood sugar level. Have the screening done:  Once every three years after age 840 if you are at a normal weight and have a low risk for diabetes.  More often and at a younger age if you are overweight or have a high risk for diabetes. What should I know about preventing infection? Hepatitis B If you have a higher risk for hepatitis B, you should be screened for this virus. Talk with your health care provider to find out if you are at risk for hepatitis B infection. Hepatitis C Testing is recommended for:  Everyone born from [id number] through 1965.  Anyone with known risk factors for hepatitis C. Sexually transmitted infections (STIs)  Get screened for STIs, including gonorrhea and chlamydia, if: ? You are sexually active and are younger than 48 years of age. ? You are older than 48 years of age and your health care provider tells you that you are at risk for this type of infection. ? Your sexual activity has changed since you were last screened, and you are at increased risk for chlamydia or gonorrhea. Ask your health care provider if you are at risk.  Ask your health care provider about whether you are at high risk for HIV. Your health care provider may recommend a prescription medicine to help prevent HIV infection. If you choose to take medicine to prevent HIV, you should first get tested for HIV. You should then be tested every 3 months for as long as you are taking the medicine. Pregnancy  If you are about to stop having your period (premenopausal) and you may become pregnant, seek counseling before you get pregnant.  Take 400 to 800 micrograms (mcg) of folic acid every day if you become pregnant.  Ask for birth control (contraception) if you want to prevent pregnancy. Osteoporosis and menopause Osteoporosis is a disease in which the bones lose minerals and strength with aging. This can result in bone fractures. If you are 48 years old or older, or if you are at risk for  osteoporosis and fractures, ask your health care provider if you should:  Be screened for bone loss.  Take a calcium or vitamin D supplement to lower your risk of fractures.  Be given hormone replacement therapy (HRT) to treat symptoms of menopause. Follow these instructions at home: Lifestyle  Do not use any products that contain nicotine or tobacco, such as cigarettes, e-cigarettes, and chewing tobacco. If you need help quitting, ask your health care provider.  Do not use street drugs.  Do not share needles.  Ask your health care provider for help if you need support or information about quitting drugs. Alcohol use  Do not drink alcohol if: ? Your health care provider tells you not to drink. ? You are pregnant, may be pregnant, or are planning to become pregnant.  If you drink alcohol: ? Limit how much you use to 0-1 drink a day. ? Limit intake if you are breastfeeding.  Be aware of how much alcohol is in your drink. In the [place] one drink  equals one 12 oz bottle of beer (355 mL), one 5 oz glass of wine (148 mL), or one 1 oz glass of hard liquor (44 mL). General instructions  Schedule regular health, dental, and eye exams.  Stay current with your vaccines.  Tell your health care provider if: ? You often feel depressed. ? You have ever been abused or do not feel safe at home. Summary  Adopting a healthy lifestyle and getting preventive care are important in promoting health and wellness.  Follow your health care provider's instructions about healthy diet, exercising, and getting tested or screened for diseases.  Follow your health care provider's instructions on monitoring your cholesterol and blood pressure. This information is not intended to replace advice given to you by your health care provider. Make sure you discuss any questions you have with your health care provider. Document Released: 06/18/2011 Document Revised: 11/26/2018 Document Reviewed:  11/26/2018 Elsevier Patient Education  2020 [employer].     Why follow it? Research shows. . Those who follow the Mediterranean diet have a reduced risk of heart disease  . The diet is associated with a reduced incidence of Parkinson's and Alzheimer's diseases . People following the diet may have longer life expectancies and lower rates of chronic diseases  . The Dietary Guidelines for Americans recommends the Mediterranean diet as an eating plan to promote health and prevent disease  What Is the Mediterranean Diet?  . Healthy eating plan based on typical foods and recipes of Mediterranean-style cooking . The diet is primarily a plant based diet; these foods should make up a majority of meals   Starches - Plant based foods should make up a majority of meals - They are an important sources of vitamins, minerals, energy, antioxidants, and fiber - Choose whole grains, foods high in fiber and minimally processed items  - Typical grain sources include wheat, oats, barley, corn, brown rice, bulgar, farro, millet, polenta, couscous  - Various types of beans include chickpeas, lentils, fava beans, black beans, white beans   Fruits  Veggies - Large quantities of antioxidant rich fruits & veggies; 6 or more servings  - Vegetables can be eaten raw or lightly drizzled with oil and cooked  - Vegetables common to the traditional Mediterranean Diet include: artichokes, arugula, beets, broccoli, brussel sprouts, cabbage, carrots, celery, collard greens, cucumbers, eggplant, kale, leeks, lemons, lettuce, mushrooms, okra, onions, peas, peppers, potatoes, pumpkin, radishes, rutabaga, shallots, spinach, sweet potatoes, turnips, zucchini - Fruits common to the Mediterranean Diet include: apples, apricots, avocados, cherries, clementines, dates, figs, grapefruits, grapes, melons, nectarines, oranges, peaches, pears, pomegranates, strawberries, tangerines  Fats - Replace butter and margarine with healthy oils,  such as olive oil, canola oil, and tahini  - Limit nuts to no more than a handful a day  - Nuts include walnuts, almonds, pecans, pistachios, pine nuts  - Limit or avoid candied, honey roasted or heavily salted nuts - Olives are central to the [employer] - can be eaten whole or used in a variety of dishes   Meats Protein - Limiting red meat: no more than a few times a month - When eating red meat: choose lean cuts and keep the portion to the size of deck of cards - Eggs: approx. 0 to 4 times a week  - Fish and lean poultry: at least 2 a week  - Healthy protein sources include, chicken, [place], lean beef, lamb - Increase intake of seafood such as tuna, salmon, trout, mackerel, shrimp, scallops - Avoid  or limit high fat processed meats such as sausage and bacon  Dairy - Include moderate amounts of low fat dairy products  - Focus on healthy dairy such as fat free yogurt, skim milk, low or reduced fat cheese - Limit dairy products higher in fat such as whole or 2% milk, cheese, ice cream  Alcohol - Moderate amounts of red wine is ok  - No more than 5 oz daily for women (all ages) and men older than age 39  - No more than 10 oz of wine daily for men younger than 37  Other - Limit sweets and other desserts  - Use herbs and spices instead of salt to flavor foods  - Herbs and spices common to the traditional Mediterranean Diet include: basil, bay leaves, chives, cloves, cumin, fennel, garlic, lavender, marjoram, mint, oregano, parsley, pepper, rosemary, sage, savory, sumac, tarragon, thyme   It's not just a diet, it's a lifestyle:  . The Mediterranean diet includes lifestyle factors typical of those in the region  . Foods, drinks and meals are best eaten with others and savored . Daily physical activity is important for overall good health . This could be strenuous exercise like running and aerobics . This could also be more leisurely activities such as walking, housework, yard-work, or  taking the stairs . Moderation is the key; a balanced and healthy diet accommodates most foods and drinks . Consider portion sizes and frequency of consumption of certain foods   Meal Ideas & Options:  . Breakfast:  o Whole wheat toast or whole wheat English muffins with peanut butter & hard boiled egg o Steel cut oats topped with apples & cinnamon and skim milk  o Fresh fruit: banana, strawberries, melon, berries, peaches  o Smoothies: strawberries, bananas, greek yogurt, peanut butter o Low fat greek yogurt with blueberries and granola  o Egg white omelet with spinach and mushrooms o Breakfast couscous: whole wheat couscous, apricots, skim milk, cranberries  . Sandwiches:  o Hummus and grilled vegetables (peppers, zucchini, squash) on whole wheat bread   o Grilled chicken on whole wheat pita with lettuce, tomatoes, cucumbers or tzatziki  o Tuna salad on whole wheat bread: tuna salad made with greek yogurt, olives, red peppers, capers, green onions o Garlic rosemary lamb pita: lamb sauted with garlic, rosemary, salt & pepper; add lettuce, cucumber, greek yogurt to pita - flavor with lemon juice and black pepper  . Seafood:  o Mediterranean grilled salmon, seasoned with garlic, basil, parsley, lemon juice and black pepper o Shrimp, lemon, and spinach whole-grain pasta salad made with low fat greek yogurt  o Seared scallops with lemon orzo  o Seared tuna steaks seasoned salt, pepper, coriander topped with tomato mixture of olives, tomatoes, olive oil, minced garlic, parsley, green onions and cappers  . Meats:  o Herbed greek chicken salad with kalamata olives, cucumber, feta  o Red bell peppers stuffed with spinach, bulgur, lean ground beef (or lentils) & topped with feta   o Kebabs: skewers of chicken, tomatoes, onions, zucchini, squash  o [place] burgers: made with red onions, mint, dill, lemon juice, feta cheese topped with roasted red peppers . Vegetarian o Cucumber salad: cucumbers,  artichoke hearts, celery, red onion, feta cheese, tossed in olive oil & lemon juice  o Hummus and whole grain pita points with a greek salad (lettuce, tomato, feta, olives, cucumbers, red onion) o Lentil soup with celery, carrots made with vegetable broth, garlic, salt and pepper  o Tabouli salad: parsley,  bulgur, mint, scallions, cucumbers, tomato, radishes, lemon juice, olive oil, salt and pepper.       Fat and Cholesterol Restricted Eating Plan Eating a diet that limits fat and cholesterol may help lower your risk for heart disease and other conditions. Your body needs fat and cholesterol for basic functions, but eating too much of these things can be harmful to your health. Your health care provider may order lab tests to check your blood fat (lipid) and cholesterol levels. This helps your health care provider understand your risk for certain conditions and whether you need to make diet changes. Work with your health care provider or dietitian to make an eating plan that is right for you. Your plan includes:  Limit your fat intake to ______% or less of your total calories a day.  Limit your saturated fat intake to ______% or less of your total calories a day.  Limit the amount of cholesterol in your diet to less than _________mg a day.  Eat ___________ g of fiber a day. What are tips for following this plan? General guidelines   If you are overweight, work with your health care provider to lose weight safely. Losing just 5-10% of your body weight can improve your overall health and help prevent diseases such as diabetes and heart disease.  Avoid: ? Foods with added sugar. ? Fried foods. ? Foods that contain partially hydrogenated oils, including stick margarine, some tub margarines, cookies, crackers, and other baked goods.  Limit alcohol intake to no more than 1 drink a day for nonpregnant women and 2 drinks a day for men. One drink equals 12 oz of beer, 5 oz of wine, or 1 oz of  hard liquor. Reading food labels  Check food labels for: ? Trans fats, partially hydrogenated oils, or high amounts of saturated fat. Avoid foods that contain saturated fat and trans fat. ? The amount of cholesterol in each serving. Try to eat no more than 200 mg of cholesterol each day. ? The amount of fiber in each serving. Try to eat at least 20-30 g of fiber each day.  Choose foods with healthy fats, such as: ? Monounsaturated and polyunsaturated fats. These include olive and canola oil, flaxseeds, walnuts, almonds, and seeds. ? Omega-3 fats. These are found in foods such as salmon, mackerel, sardines, tuna, flaxseed oil, and ground flaxseeds.  Choose grain products that have whole grains. Look for the word "whole" as the first word in the ingredient list. Cooking  Cook foods using methods other than frying. Baking, boiling, grilling, and broiling are some healthy options.  Eat more home-cooked food and less restaurant, buffet, and fast food.  Avoid cooking using saturated fats. ? Animal sources of saturated fats include meats, butter, and cream. ? Plant sources of saturated fats include palm oil, palm kernel oil, and coconut oil. Meal planning   At meals, imagine dividing your plate into fourths: ? Fill one-half of your plate with vegetables and green salads. ? Fill one-fourth of your plate with whole grains. ? Fill one-fourth of your plate with lean protein foods.  Eat fish that is high in omega-3 fats at least two times a week.  Eat more foods that contain fiber, such as whole grains, beans, apples, broccoli, carrots, peas, and barley. These foods help promote healthy cholesterol levels in the blood. Recommended foods Grains  Whole grains, such as whole wheat or whole grain breads, crackers, cereals, and pasta. Unsweetened oatmeal, bulgur, barley, quinoa, or brown rice. Corn  or whole wheat flour tortillas. Vegetables  Fresh or frozen vegetables (raw, steamed, roasted, or  grilled). Green salads. Fruits  All fresh, canned (in natural juice), or frozen fruits. Meats and other protein foods  Ground beef (85% or leaner), grass-fed beef, or beef trimmed of fat. Skinless chicken or [place]. Ground chicken or [place]. Pork trimmed of fat. All fish and seafood. Egg whites. Dried beans, peas, or lentils. Unsalted nuts or seeds. Unsalted canned beans. Natural nut butters without added sugar and oil. Dairy  Low-fat or nonfat dairy products, such as skim or 1% milk, 2% or reduced-fat cheeses, low-fat and fat-free ricotta or cottage cheese, or plain low-fat and nonfat yogurt. Fats and oils  Tub margarine without trans fats. Light or reduced-fat mayonnaise and salad dressings. Avocado. Olive, canola, sesame, or safflower oils. The items listed above may not be a complete list of recommended foods or beverages. Contact your dietitian for more options. Foods to avoid Grains  White bread. White pasta. White rice. Cornbread. Bagels, pastries, and croissants. Crackers and snack foods that contain trans fat and hydrogenated oils. Vegetables  Vegetables cooked in cheese, cream, or butter sauce. Fried vegetables. Fruits  Canned fruit in heavy syrup. Fruit in cream or butter sauce. Fried fruit. Meats and other protein foods  Fatty cuts of meat. Ribs, chicken wings, bacon, sausage, bologna, salami, chitterlings, fatback, hot dogs, bratwurst, and packaged lunch meats. Liver and organ meats. Whole eggs and egg yolks. Chicken and [place] with skin. Fried meat. Dairy  Whole or 2% milk, cream, half-and-half, and cream cheese. Whole milk cheeses. Whole-fat or sweetened yogurt. Full-fat cheeses. Nondairy creamers and whipped toppings. Processed cheese, cheese spreads, and cheese curds. Beverages  Alcohol. Sugar-sweetened drinks such as sodas, lemonade, and fruit drinks. Fats and oils  Butter, stick margarine, lard, shortening, ghee, or bacon fat. Coconut, palm kernel, and palm  oils. Sweets and desserts  Corn syrup, sugars, honey, and molasses. Candy. Jam and jelly. Syrup. Sweetened cereals. Cookies, pies, cakes, donuts, muffins, and ice cream. The items listed above may not be a complete list of foods and beverages to avoid. Contact your dietitian for more information. Summary  Your body needs fat and cholesterol for basic functions. However, eating too much of these things can be harmful to your health.  Work with your health care provider and dietitian to follow a diet low in fat and cholesterol. Doing this may help lower your risk for heart disease and other conditions.  Choose healthy fats, such as monounsaturated and polyunsaturated fats, and foods high in omega-3 fatty acids.  Eat fiber-rich foods, such as whole grains, beans, peas, fruits, and vegetables.  Limit or avoid alcohol, fried foods, and foods high in saturated fats, partially hydrogenated oils, and sugar. This information is not intended to replace advice given to you by your health care provider. Make sure you discuss any questions you have with your health care provider. Document Released: 12/03/2005 Document Revised: 11/15/2017 Document Reviewed: 08/20/2017 Elsevier Patient Education  2020 [employer].  American Heart Association (AHA) Exercise Recommendation  Being physically active is important to prevent heart disease and stroke, the nation's No. 1and No. 5killers. To improve overall cardiovascular health, we suggest at least 150 minutes per week of moderate exercise or 75 minutes per week of vigorous exercise (or a combination of moderate and vigorous activity). Thirty minutes a day, five times a week is an easy goal to remember. You will also experience benefits even if you divide your time into two or  three segments of 10 to 15 minutes per day.  For people who would benefit from lowering their blood pressure or cholesterol, we recommend 40 minutes of aerobic exercise of moderate to  vigorous intensity three to four times a week to lower the risk for heart attack and stroke.  Physical activity is anything that makes you move your body and burn calories.  This includes things like climbing stairs or playing sports. Aerobic exercises benefit your heart, and include walking, jogging, swimming or biking. Strength and stretching exercises are best for overall stamina and flexibility.  The simplest, positive change you can make to effectively improve your heart health is to start walking. It's enjoyable, free, easy, social and great exercise. A walking program is flexible and boasts high success rates because people can stick with it. It's easy for walking to become a regular and satisfying part of life.   For Overall Cardiovascular Health:  At least 30 minutes of moderate-intensity aerobic activity at least 5 days per week for a total of 150  OR   At least 25 minutes of vigorous aerobic activity at least 3 days per week for a total of 75 minutes; or a combination of moderate- and vigorous-intensity aerobic activity  AND   Moderate- to high-intensity muscle-strengthening activity at least 2 days per week for additional health benefits.  For Lowering Blood Pressure and Cholesterol  An average 40 minutes of moderate- to vigorous-intensity aerobic activity 3 or 4 times per week  What if I can't make it to the time goal? Something is always better than nothing! And everyone has to start somewhere. Even if you've been sedentary for years, today is the day you can begin to make healthy changes in your life. If you don't think you'll make it for 30 or 40 minutes, set a reachable goal for today. You can work up toward your overall goal by increasing your time as you get stronger. Don't let all-or-nothing thinking rob you of doing what you can every day.  Source:http://www.heart.org

## 2019-10-19 NOTE — Progress Notes (Signed)
Established Patient Office Visit  Subjective:  Patient ID: Alyssa Gonzales, female    DOB: 11-Apr-1971  Age: 48 y.o. MRN: [id number]  CC:  Chief Complaint  Patient presents with  . Establish Care  . Annual Exam  . Medication Refill    prilosec,potassium,carafate    HPI Alyssa Gonzales presents for CPE  No complaints  Wants refills of omeprazole and sucralfate. Reports GERD is well controlled with these.   Otherwise, feels healthy.   History reviewed. No pertinent past medical history.  History reviewed. No pertinent surgical history.  Family History  Problem Relation Age of Onset  . Diabetes Father     Social History   Socioeconomic History  . Marital status: Married    Spouse name: Not on file  . Number of children: Not on file  . Years of education: Not on file  . Highest education level: Not on file  Occupational History  . Not on file  Social Needs  . Financial resource strain: Not on file  . Food insecurity    Worry: Not on file    Inability: Not on file  . Transportation needs    Medical: Not on file    Non-medical: Not on file  Tobacco Use  . Smoking status: Never Smoker  . Smokeless tobacco: Never Used  Substance and Sexual Activity  . Alcohol use: No    Alcohol/week: 0.0 standard drinks  . Drug use: No  . Sexual activity: Not on file  Lifestyle  . Physical activity    Days per week: Not on file    Minutes per session: Not on file  . Stress: Not on file  Relationships  . Social Herbalist on phone: Not on file    Gets together: Not on file    Attends religious service: Not on file    Active member of club or organization: Not on file    Attends meetings of clubs or organizations: Not on file    Relationship status: Not on file  . Intimate partner violence    Fear of current or ex partner: Not on file    Emotionally abused: Not on file    Physically abused: Not on file    Forced sexual activity: Not on file  Other Topics Concern  .  Not on file  Social History Narrative  . Not on file    Outpatient Medications Prior to Visit  Medication Sig Dispense Refill  . potassium chloride (K-DUR) 10 MEQ tablet Take 1 tablet (10 mEq total) by mouth daily. 4 tablet 0  . omeprazole (PRILOSEC) 20 MG capsule Take 1 capsule (20 mg total) by mouth daily. 30 capsule 0  . sucralfate (CARAFATE) 1 g tablet Take 1 tablet (1 g total) by mouth 4 (four) times daily -  with meals and at bedtime. 120 tablet 0   No facility-administered medications prior to visit.     No Known Allergies  ROS Review of Systems  Constitutional: Negative.   HENT: Negative.   Eyes: Negative.   Respiratory: Negative.   Cardiovascular: Negative.   Gastrointestinal: Negative.   Endocrine: Negative.   Genitourinary: Negative.   Musculoskeletal: Negative.   Skin: Negative.   Allergic/Immunologic: Negative.   Neurological: Negative.   Hematological: Negative.   Psychiatric/Behavioral: Negative.   All other systems reviewed and are negative.     Objective:    Physical Exam  Constitutional: She is oriented to person, place, and time. She appears well-developed  and well-nourished. No distress.  HENT:  Head: Normocephalic and atraumatic.  Right Ear: External ear normal.  Left Ear: External ear normal.  Nose: Nose normal.  Mouth/Throat: Oropharynx is clear and moist. No oropharyngeal exudate.  Eyes: Pupils are equal, round, and reactive to light. Conjunctivae and EOM are normal. Right eye exhibits no discharge. Left eye exhibits no discharge. No scleral icterus.  Neck: Normal range of motion. Neck supple. No tracheal deviation present. No thyromegaly present.  Cardiovascular: Normal rate, regular rhythm, normal heart sounds and intact distal pulses. Exam reveals no gallop and no friction rub.  No murmur heard. Pulmonary/Chest: Effort normal and breath sounds normal. No respiratory distress. She has no wheezes. She has no rales. She exhibits no tenderness.   Abdominal: Soft. Bowel sounds are normal. She exhibits no distension and no mass. There is no abdominal tenderness. There is no rebound and no guarding.  Musculoskeletal: Normal range of motion.        General: No tenderness, deformity or edema.  Lymphadenopathy:    She has no cervical adenopathy.  Neurological: She is alert and oriented to person, place, and time. No cranial nerve deficit. She exhibits normal muscle tone. Coordination normal.  Skin: Skin is warm and dry. No rash noted. She is not diaphoretic. No erythema. No pallor.  Psychiatric: She has a normal mood and affect. Her behavior is normal. Judgment and thought content normal.  Nursing note and vitals reviewed.   BP (!) 115/59   Pulse 65   Temp 97.9 F (36.6 C) (Oral)   Resp 16   Ht 5\' 1"  (1.549 m)   Wt 136 lb (61.7 kg)   SpO2 99%   BMI 25.70 kg/m  Wt Readings from Last 3 Encounters:  10/19/19 136 lb (61.7 kg)  12/29/18 132 lb 4.4 oz (60 kg)  10/24/16 132 lb (59.9 kg)     Health Maintenance Due  Topic Date Due  . TETANUS/TDAP  06/03/1990  . PAP SMEAR-Modifier  06/03/1992  . INFLUENZA VACCINE  07/18/2019    There are no preventive care reminders to display for this patient.  Lab Results  Component Value Date   TSH 2.14 10/24/2016   Lab Results  Component Value Date   WBC 11.2 (H) 12/29/2018   HGB 12.6 12/29/2018   HCT 37.0 12/29/2018   MCV 67.7 (L) 12/29/2018   PLT 246 12/29/2018   Lab Results  Component Value Date   NA 140 12/29/2018   K 2.6 (LL) 12/29/2018   CO2 17 (L) 12/29/2018   GLUCOSE 164 (H) 12/29/2018   BUN 21 (H) 12/29/2018   CREATININE 0.50 12/29/2018   BILITOT 0.7 12/29/2018   ALKPHOS 51 12/29/2018   AST 29 12/29/2018   ALT 14 12/29/2018   PROT 7.1 12/29/2018   ALBUMIN 3.7 12/29/2018   CALCIUM 9.0 12/29/2018   ANIONGAP 15 12/29/2018   Lab Results  Component Value Date   CHOL 192 10/24/2016   Lab Results  Component Value Date   HDL 45 (L) 10/24/2016   Lab Results   Component Value Date   LDLCALC 128 (H) 10/24/2016   Lab Results  Component Value Date   TRIG 93 10/24/2016   Lab Results  Component Value Date   CHOLHDL 4.3 10/24/2016   Lab Results  Component Value Date   HGBA1C 5.4 10/24/2016      Assessment & Plan:   Problem List Items Addressed This Visit      Digestive   Gastroesophageal reflux disease  Relevant Medications   omeprazole (PRILOSEC) 20 MG capsule   sucralfate (CARAFATE) 1 g tablet    Other Visit Diagnoses    Routine general medical examination at a health care facility    -  Primary   Screening for endocrine, metabolic and immunity disorder       Relevant Orders   Comprehensive metabolic panel   CBC   Hemoglobin A1c   TSH   Lipid screening       Relevant Orders   Lipid panel      Meds ordered this encounter  Medications  . DISCONTD: omeprazole (PRILOSEC) 20 MG capsule    Sig: Take 1 capsule (20 mg total) by mouth daily.    Dispense:  90 capsule    Refill:  3    Order Specific Question:   Supervising Provider    Answer:   Collie SiadSTALLINGS, ZOE A [id number]]  . DISCONTD: sucralfate (CARAFATE) 1 g tablet    Sig: Take 1 tablet (1 g total) by mouth 4 (four) times daily -  with meals and at bedtime.    Dispense:  120 tablet    Refill:  3    Order Specific Question:   Supervising Provider    Answer:   Collie SiadSTALLINGS, ZOE A [id number]]  . omeprazole (PRILOSEC) 20 MG capsule    Sig: Take 1 capsule (20 mg total) by mouth daily.    Dispense:  90 capsule    Refill:  3    Order Specific Question:   Supervising Provider    Answer:   Collie SiadSTALLINGS, ZOE A [id number]]  . sucralfate (CARAFATE) 1 g tablet    Sig: Take 1 tablet (1 g total) by mouth 4 (four) times daily -  with meals and at bedtime.    Dispense:  120 tablet    Refill:  3    Order Specific Question:   Supervising Provider    Answer:   Doristine BosworthSTALLINGS, ZOE A [id number]]    Follow-up: Return in 1 year (on 10/18/2020).   PLAN  Normal findings on exam  Work form signed  Labs  drawn, will follow up as warranted  Patient encouraged to call clinic with any questions, comments, or concerns.   Janeece Ageeichard Kelson Queenan, NP

## 2019-10-20 LAB — COMPREHENSIVE METABOLIC PANEL
ALT: 12 IU/L (ref 0–32)
AST: 19 IU/L (ref 0–40)
Albumin/Globulin Ratio: 1.5 (ref 1.2–2.2)
Albumin: 4.3 g/dL (ref 3.8–4.8)
Alkaline Phosphatase: 70 IU/L (ref 39–117)
BUN/Creatinine Ratio: 30 — ABNORMAL HIGH (ref 9–23)
BUN: 16 mg/dL (ref 6–24)
Bilirubin Total: 0.2 mg/dL (ref 0.0–1.2)
CO2: 19 mmol/L — ABNORMAL LOW (ref 20–29)
Calcium: 8.9 mg/dL (ref 8.7–10.2)
Chloride: 104 mmol/L (ref 96–106)
Creatinine, Ser: 0.54 mg/dL — ABNORMAL LOW (ref 0.57–1.00)
GFR calc Af Amer: 129 mL/min/{1.73_m2} (ref >59)
GFR calc non Af Amer: 112 mL/min/{1.73_m2} (ref >59)
Globulin, Total: 2.8 g/dL (ref 1.5–4.5)
Glucose: 82 mg/dL (ref 65–99)
Potassium: 3.8 mmol/L (ref 3.5–5.2)
Sodium: 140 mmol/L (ref 134–144)
Total Protein: 7.1 g/dL (ref 6.0–8.5)

## 2019-10-20 LAB — LIPID PANEL
Chol/HDL Ratio: 3.6 ratio (ref 0.0–4.4)
Cholesterol, Total: 167 mg/dL (ref 100–199)
HDL: 46 mg/dL (ref >39)
LDL Chol Calc (NIH): 107 mg/dL — ABNORMAL HIGH (ref 0–99)
Triglycerides: 71 mg/dL (ref 0–149)
VLDL Cholesterol Cal: 14 mg/dL (ref 5–40)

## 2019-10-20 LAB — HEMOGLOBIN A1C
Est. average glucose Bld gHb Est-mCnc: 114 mg/dL
Hgb A1c MFr Bld: 5.6 % (ref 4.8–5.6)

## 2019-10-20 LAB — CBC
Hematocrit: 38.1 % (ref 34.0–46.6)
Hemoglobin: 11.4 g/dL (ref 11.1–15.9)
MCH: 20.8 pg — ABNORMAL LOW (ref 26.6–33.0)
MCHC: 29.9 g/dL — ABNORMAL LOW (ref 31.5–35.7)
MCV: 70 fL — ABNORMAL LOW (ref 79–97)
Platelets: 302 10*3/uL (ref 150–450)
RBC: 5.48 x10E6/uL — ABNORMAL HIGH (ref 3.77–5.28)
RDW: 15.5 % — ABNORMAL HIGH (ref 11.7–15.4)
WBC: 7.4 10*3/uL (ref 3.4–10.8)

## 2019-10-20 LAB — TSH: TSH: 2.1 u[IU]/mL (ref 0.450–4.500)

## 2019-10-26 ENCOUNTER — Encounter: Payer: Self-pay | Admitting: Radiology

## 2019-11-19 DIAGNOSIS — Z20828 Contact with and (suspected) exposure to other viral communicable diseases: Secondary | ICD-10-CM | POA: Diagnosis not present

## 2020-06-28 IMAGING — CT CT ANGIO CHEST-ABD-PELV FOR DISSECTION W/ AND WO/W CM
2 of 7 series · 14 of 46 positions shown, 16 images · IV contrast (iopamidol)
Comparison: None.

CLINICAL DATA: 47 y/o F; abdominal pain, emesis, and chest pain
after eating shrimp.

EXAM:
CT ANGIOGRAPHY CHEST, ABDOMEN AND PELVIS
TECHNIQUE: Multidetector CT imaging through the chest, abdomen and pelvis was
performed using the standard protocol during bolus administration of
intravenous contrast. Multiplanar reconstructed images and MIPs were
obtained and reviewed to evaluate the vascular anatomy.
CONTRAST:  100mL WXM3VE-CEY IOPAMIDOL (WXM3VE-CEY) INJECTION 76%

[Series 7: dissection 2mm · axial · 0.78mm/px · z∈[-408,+132]mm · 11 of 301 slices shown, 13 images]
[im 16/301  soft-tissue]
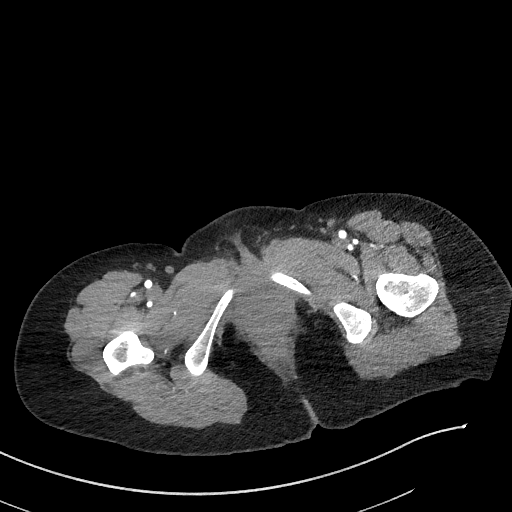
[im 16/301  bone]
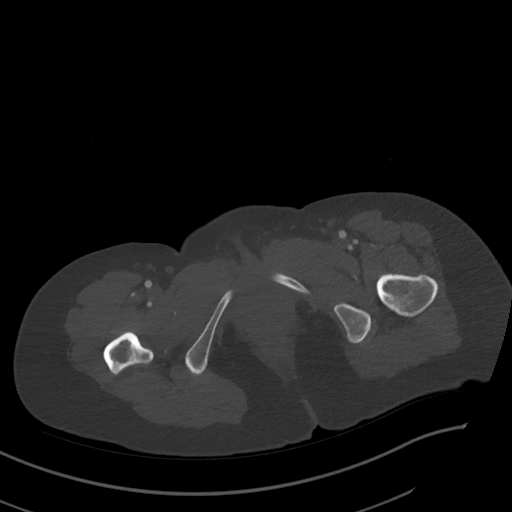
[im 46/301  soft-tissue]
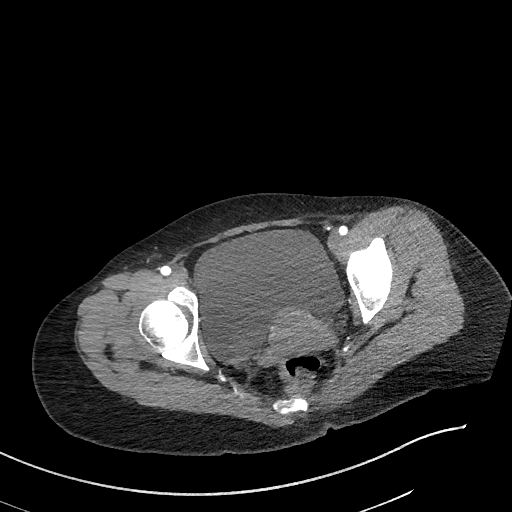
[im 76/301  soft-tissue]
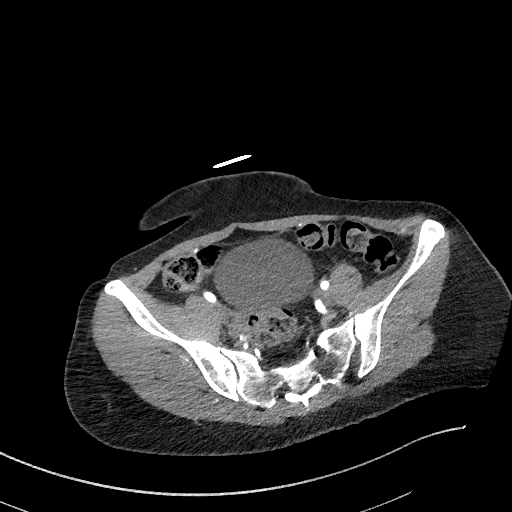
[im 106/301  soft-tissue]
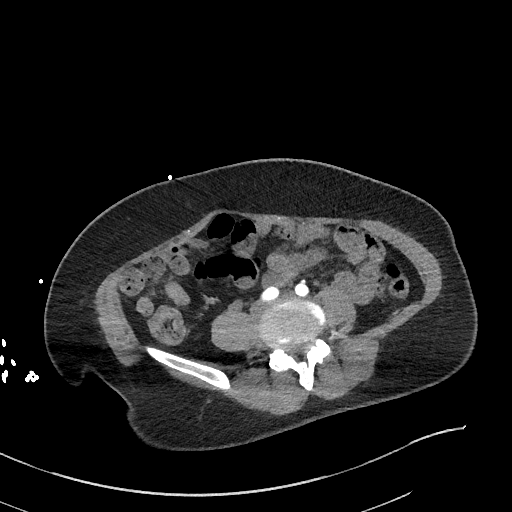
[im 121/301  soft-tissue]
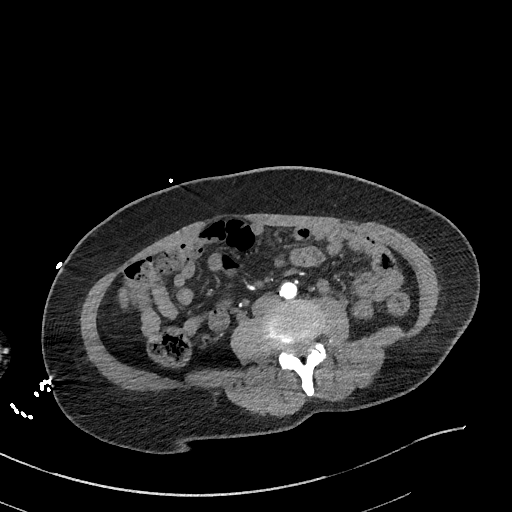
[im 151/301  soft-tissue]
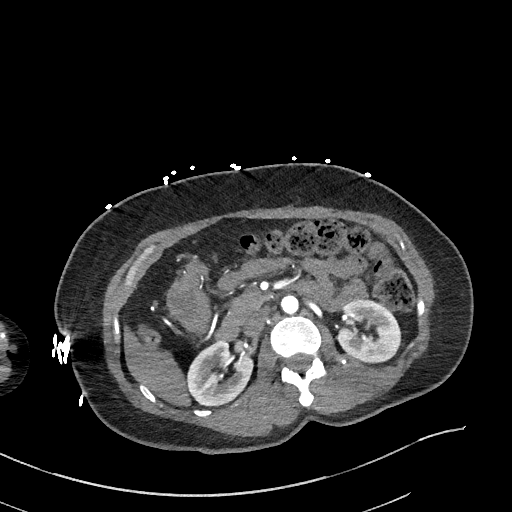
[im 181/301  soft-tissue]
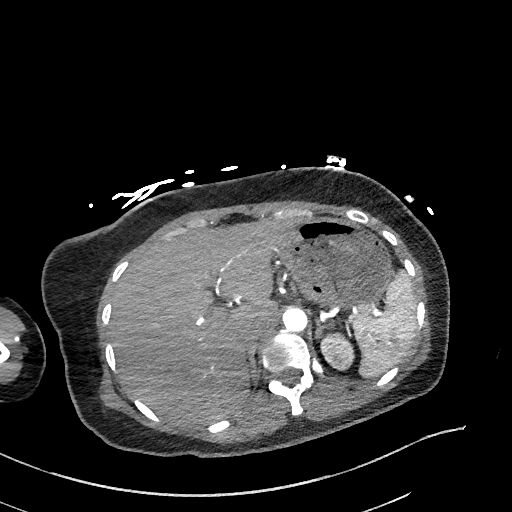
[im 196/301  soft-tissue]
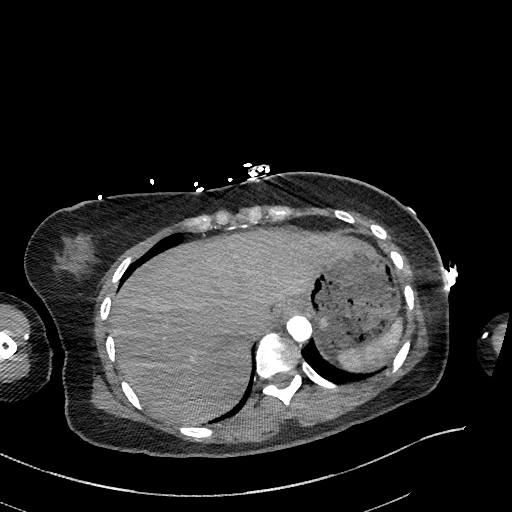
[im 226/301  soft-tissue]
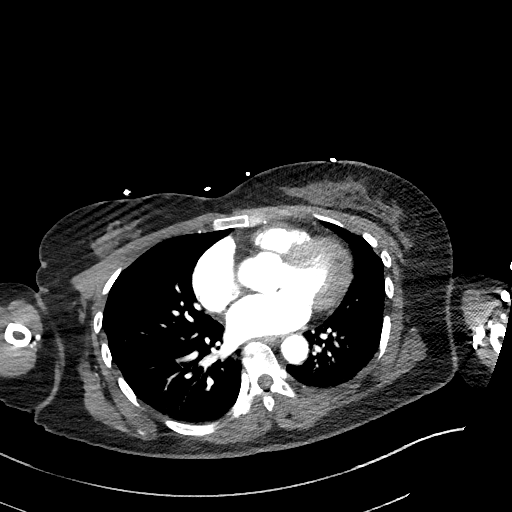
[im 226/301  bone]
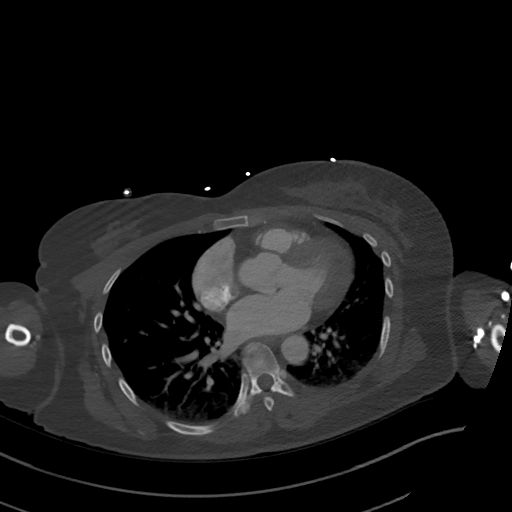
[im 256/301  soft-tissue]
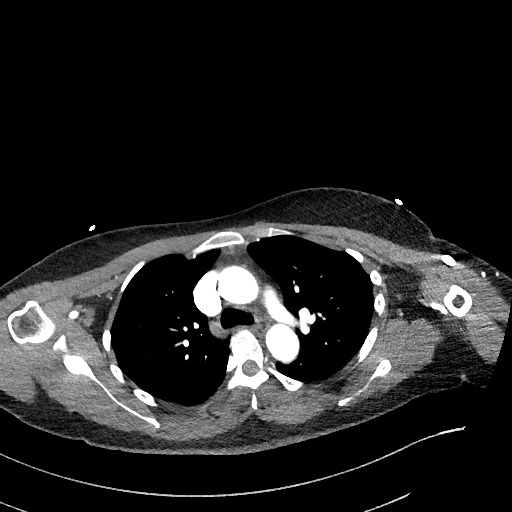
[im 286/301  soft-tissue]
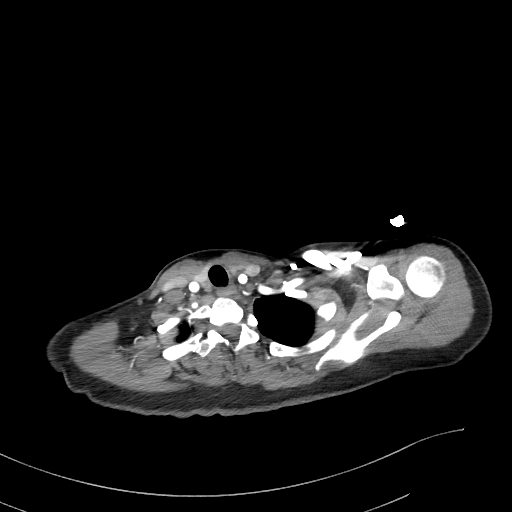

[Series 10: dissection 2mm cor · coronal · 0.74mm/px · 3 of 112 slices shown]
[im 28/112  soft-tissue]
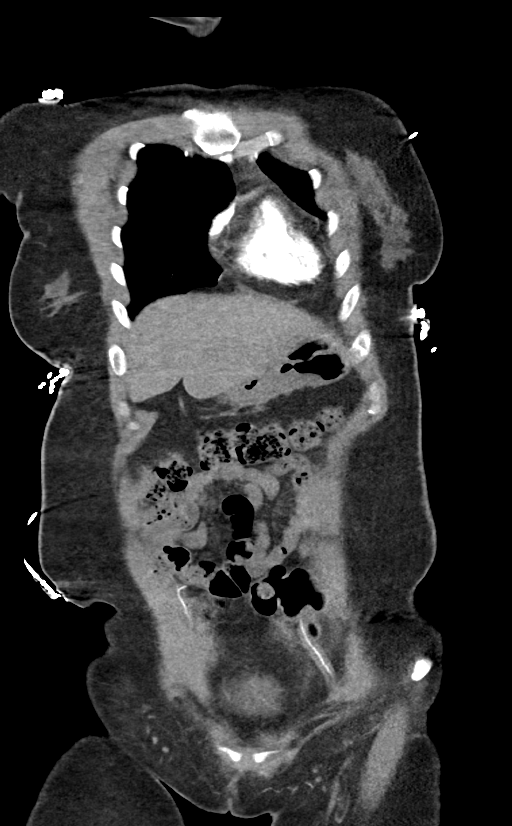
[im 56/112  soft-tissue]
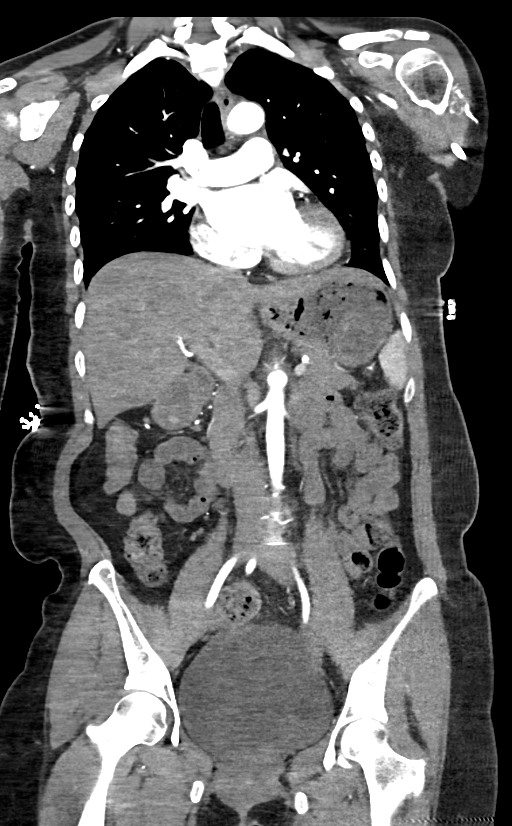
[im 84/112  soft-tissue]
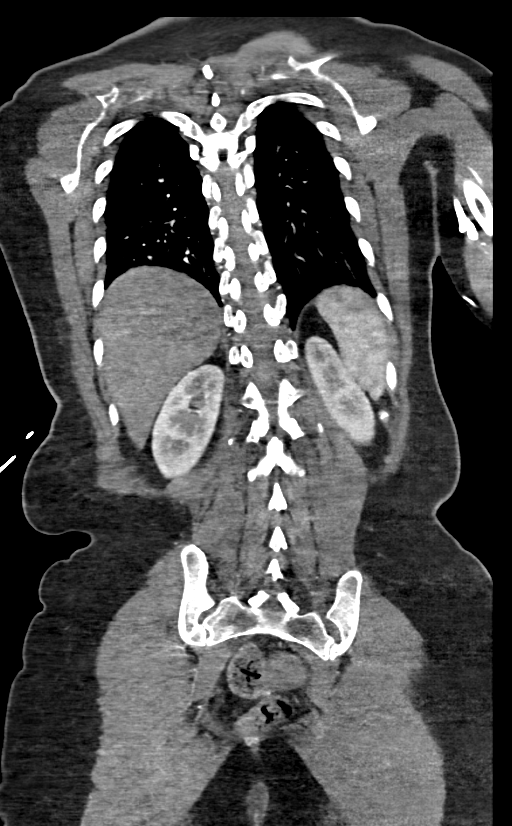

[14 of 46 positions shown; findings below may reference images not displayed]

FINDINGS: CTA CHEST FINDINGS

Cardiovascular: Preferential opacification of the thoracic aorta. No
evidence of thoracic aortic aneurysm or dissection. Normal heart
size. No pericardial effusion.

Mediastinum/Nodes: No enlarged mediastinal, hilar, or axillary lymph
nodes. Thyroid gland, trachea, and esophagus demonstrate no
significant findings.

Lungs/Pleura: Left lower lobe platelike atelectasis. No
consolidation, effusion, or pneumothorax.

Musculoskeletal: No chest wall abnormality. No acute or significant
osseous findings.

Review of the MIP images confirms the above findings.

CTA ABDOMEN AND PELVIS FINDINGS

VASCULAR

Aorta: Normal caliber aorta without aneurysm, dissection, vasculitis
or significant stenosis.

Celiac: Patent without evidence of aneurysm, dissection, vasculitis
or significant stenosis.

SMA: Patent without evidence of aneurysm, dissection, vasculitis or
significant stenosis.

Renals: Both renal arteries are patent without evidence of aneurysm,
dissection, vasculitis, fibromuscular dysplasia or significant
stenosis.

IMA: Patent without evidence of aneurysm, dissection, vasculitis or
significant stenosis.

Inflow: Patent without evidence of aneurysm, dissection, vasculitis
or significant stenosis.

Veins: No obvious venous abnormality within the limitations of this
arterial phase study.

Review of the MIP images confirms the above findings.

NON-VASCULAR

Hepatobiliary: No focal liver abnormality is seen. No gallstones,
gallbladder wall thickening, or biliary dilatation.

Pancreas: Unremarkable. No pancreatic ductal dilatation or
surrounding inflammatory changes.

Spleen: Normal in size without focal abnormality.

Adrenals/Urinary Tract: Adrenal glands are unremarkable. Kidneys are
normal, without renal calculi, focal lesion, or hydronephrosis.
Bladder is unremarkable.

Stomach/Bowel: Gastric distention. Small volume debris in the lower
esophagus likely representing reflux. No obstructive or inflammatory
changes of the small and large bowel. Appendix not identified, no
pericecal inflammation.

Lymphatic: Aortic atherosclerosis. No enlarged abdominal or pelvic
lymph nodes.

Reproductive: Uterus and bilateral adnexa are unremarkable.

Other: No abdominal wall hernia or abnormality. No abdominopelvic
ascites.

Musculoskeletal: No fracture is seen.

Review of the MIP images confirms the above findings.
IMPRESSION: 1. No evidence of aortic aneurysm or dissection.
2. Gastric distention. Small volume debris in the lower esophagus
likely representing reflux.
3. Left lower lobe platelike atelectasis.
4. Aortic Atherosclerosis ([id number]).

## 2020-06-28 IMAGING — DX DG CHEST 1V PORT
1 series · 1 of 1 positions shown · non-contrast
Comparison: None.

CLINICAL DATA: Chest/abdominal pain

EXAM:
PORTABLE CHEST 1 VIEW

[chest ap]
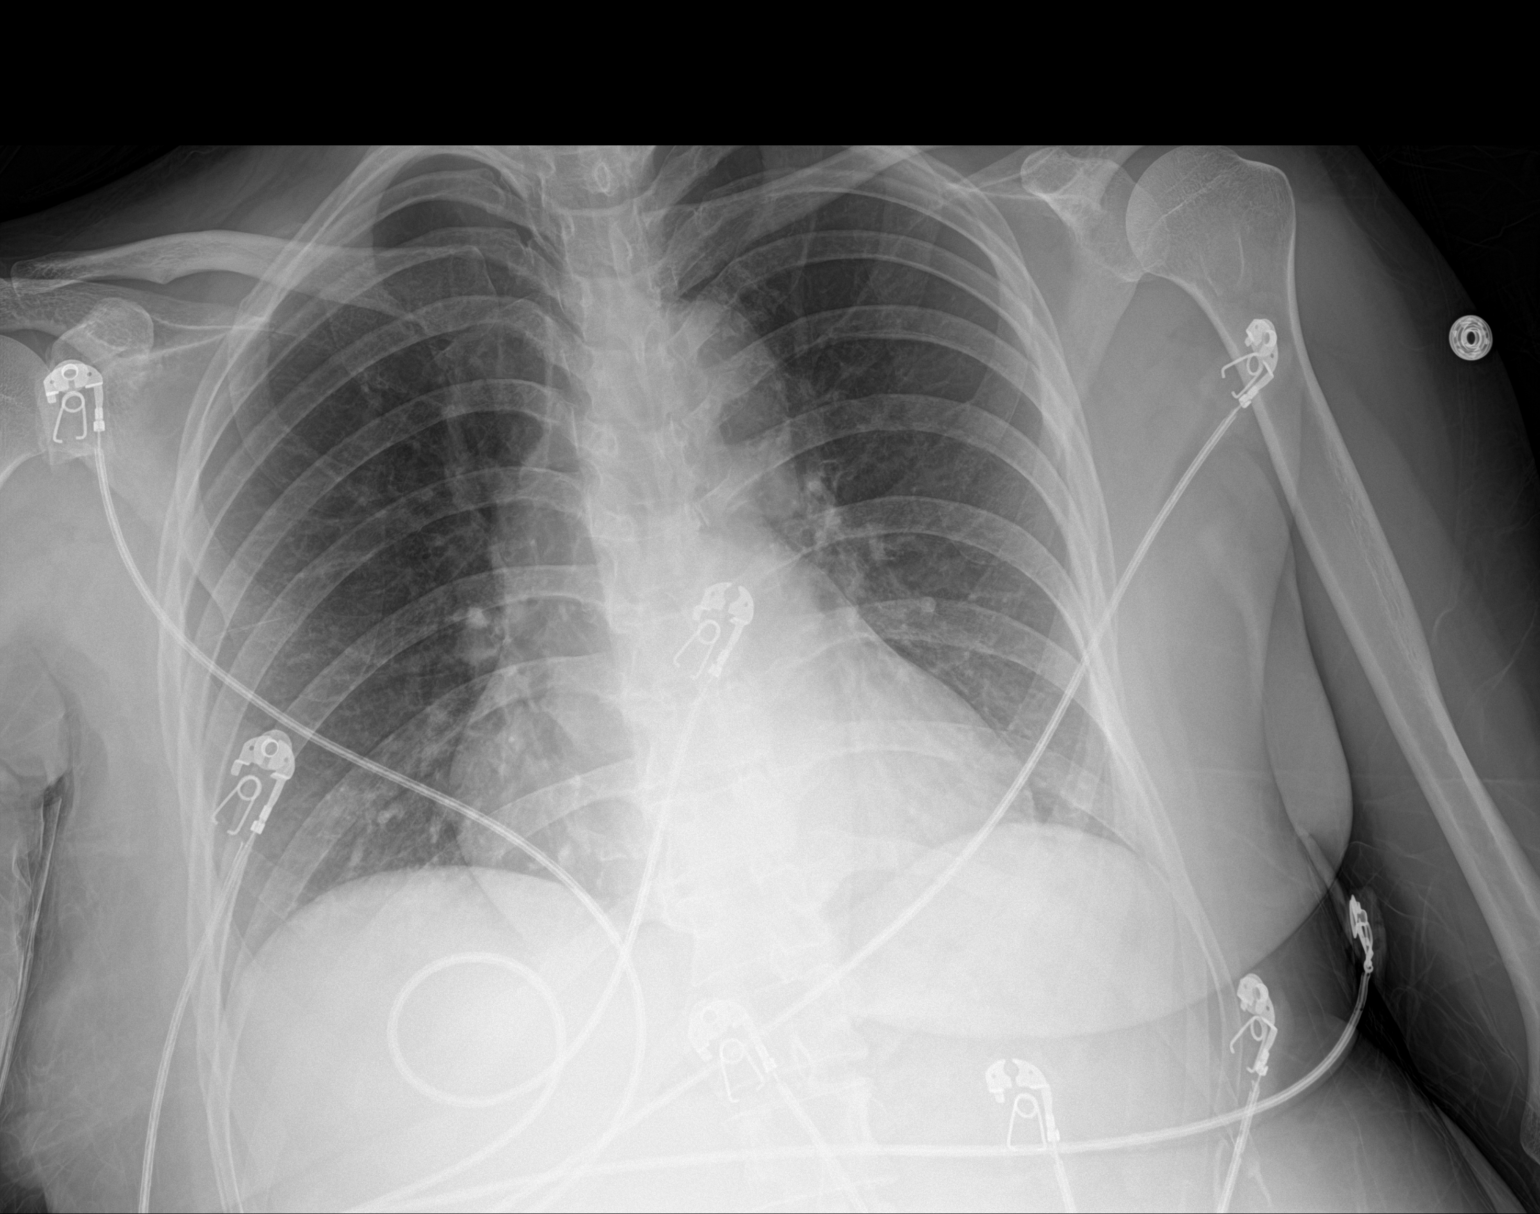

[1 of 1 positions shown; findings below may reference images not displayed]

FINDINGS: Lungs are clear.  No pleural effusion or pneumothorax.

The heart is top-normal in size.
IMPRESSION: No evidence of acute cardiopulmonary disease.

## 2020-10-26 DIAGNOSIS — E663 Overweight: Secondary | ICD-10-CM | POA: Diagnosis not present

## 2020-10-26 DIAGNOSIS — M25561 Pain in right knee: Secondary | ICD-10-CM | POA: Diagnosis not present

## 2020-10-26 DIAGNOSIS — M2550 Pain in unspecified joint: Secondary | ICD-10-CM | POA: Diagnosis not present

## 2020-10-26 DIAGNOSIS — R718 Other abnormality of red blood cells: Secondary | ICD-10-CM | POA: Diagnosis not present
